# Patient Record
Sex: Female | Born: 1980 | Race: Black or African American | Hispanic: No | Marital: Single | Smoking: Former smoker
Health system: Southern US, Community
[De-identification: ages and names within clinical notes are randomized; demographics above are authoritative.]

## PROBLEM LIST (undated history)

## (undated) DIAGNOSIS — I671 Cerebral aneurysm, nonruptured: Secondary | ICD-10-CM

## (undated) DIAGNOSIS — D61818 Other pancytopenia: Secondary | ICD-10-CM

## (undated) DIAGNOSIS — I319 Disease of pericardium, unspecified: Secondary | ICD-10-CM

## (undated) DIAGNOSIS — N189 Chronic kidney disease, unspecified: Secondary | ICD-10-CM

## (undated) DIAGNOSIS — I10 Essential (primary) hypertension: Secondary | ICD-10-CM

## (undated) DIAGNOSIS — M329 Systemic lupus erythematosus, unspecified: Secondary | ICD-10-CM

## (undated) DIAGNOSIS — I609 Nontraumatic subarachnoid hemorrhage, unspecified: Secondary | ICD-10-CM

## (undated) DIAGNOSIS — N186 End stage renal disease: Secondary | ICD-10-CM

## (undated) HISTORY — PX: BACK SURGERY: SHX140

## (undated) HISTORY — PX: BRAIN SURGERY: SHX531

---

## 2014-12-29 ENCOUNTER — Emergency Department (HOSPITAL_COMMUNITY): Payer: BLUE CROSS/BLUE SHIELD

## 2014-12-29 ENCOUNTER — Encounter (HOSPITAL_COMMUNITY): Payer: Self-pay

## 2014-12-29 ENCOUNTER — Inpatient Hospital Stay (HOSPITAL_COMMUNITY)
Admission: EM | Admit: 2014-12-29 | Discharge: 2015-01-08 | DRG: 683 | Disposition: A | Discharge status: Home / Self Care | Payer: BLUE CROSS/BLUE SHIELD | Attending: Internal Medicine | Admitting: Internal Medicine

## 2014-12-29 DIAGNOSIS — I129 Hypertensive chronic kidney disease with stage 1 through stage 4 chronic kidney disease, or unspecified chronic kidney disease: Secondary | ICD-10-CM | POA: Diagnosis present

## 2014-12-29 DIAGNOSIS — G43001 Migraine without aura, not intractable, with status migrainosus: Secondary | ICD-10-CM | POA: Diagnosis present

## 2014-12-29 DIAGNOSIS — E785 Hyperlipidemia, unspecified: Secondary | ICD-10-CM | POA: Diagnosis present

## 2014-12-29 DIAGNOSIS — Z7952 Long term (current) use of systemic steroids: Secondary | ICD-10-CM | POA: Diagnosis not present

## 2014-12-29 DIAGNOSIS — Z9889 Other specified postprocedural states: Secondary | ICD-10-CM

## 2014-12-29 DIAGNOSIS — R519 Headache, unspecified: Secondary | ICD-10-CM | POA: Insufficient documentation

## 2014-12-29 DIAGNOSIS — N179 Acute kidney failure, unspecified: Secondary | ICD-10-CM

## 2014-12-29 DIAGNOSIS — R339 Retention of urine, unspecified: Secondary | ICD-10-CM | POA: Diagnosis present

## 2014-12-29 DIAGNOSIS — M3214 Glomerular disease in systemic lupus erythematosus: Secondary | ICD-10-CM | POA: Diagnosis present

## 2014-12-29 DIAGNOSIS — E119 Type 2 diabetes mellitus without complications: Secondary | ICD-10-CM | POA: Diagnosis present

## 2014-12-29 DIAGNOSIS — E669 Obesity, unspecified: Secondary | ICD-10-CM | POA: Diagnosis present

## 2014-12-29 DIAGNOSIS — G43009 Migraine without aura, not intractable, without status migrainosus: Secondary | ICD-10-CM | POA: Diagnosis present

## 2014-12-29 DIAGNOSIS — I69354 Hemiplegia and hemiparesis following cerebral infarction affecting left non-dominant side: Secondary | ICD-10-CM | POA: Diagnosis not present

## 2014-12-29 DIAGNOSIS — M329 Systemic lupus erythematosus, unspecified: Secondary | ICD-10-CM | POA: Diagnosis present

## 2014-12-29 DIAGNOSIS — Z87891 Personal history of nicotine dependence: Secondary | ICD-10-CM

## 2014-12-29 DIAGNOSIS — I160 Hypertensive urgency: Secondary | ICD-10-CM | POA: Diagnosis present

## 2014-12-29 DIAGNOSIS — R51 Headache: Secondary | ICD-10-CM | POA: Diagnosis present

## 2014-12-29 DIAGNOSIS — Z79899 Other long term (current) drug therapy: Secondary | ICD-10-CM | POA: Diagnosis not present

## 2014-12-29 DIAGNOSIS — IMO0002 Reserved for concepts with insufficient information to code with codable children: Secondary | ICD-10-CM

## 2014-12-29 DIAGNOSIS — N183 Chronic kidney disease, stage 3 (moderate): Secondary | ICD-10-CM | POA: Diagnosis not present

## 2014-12-29 DIAGNOSIS — I10 Essential (primary) hypertension: Secondary | ICD-10-CM | POA: Diagnosis not present

## 2014-12-29 DIAGNOSIS — Z79891 Long term (current) use of opiate analgesic: Secondary | ICD-10-CM | POA: Diagnosis not present

## 2014-12-29 DIAGNOSIS — I609 Nontraumatic subarachnoid hemorrhage, unspecified: Secondary | ICD-10-CM | POA: Diagnosis not present

## 2014-12-29 DIAGNOSIS — Z8679 Personal history of other diseases of the circulatory system: Secondary | ICD-10-CM

## 2014-12-29 DIAGNOSIS — E1165 Type 2 diabetes mellitus with hyperglycemia: Secondary | ICD-10-CM | POA: Diagnosis present

## 2014-12-29 DIAGNOSIS — N189 Chronic kidney disease, unspecified: Secondary | ICD-10-CM | POA: Diagnosis present

## 2014-12-29 DIAGNOSIS — G43011 Migraine without aura, intractable, with status migrainosus: Secondary | ICD-10-CM | POA: Diagnosis present

## 2014-12-29 DIAGNOSIS — I607 Nontraumatic subarachnoid hemorrhage from unspecified intracranial artery: Secondary | ICD-10-CM

## 2014-12-29 HISTORY — DX: Systemic lupus erythematosus, unspecified: M32.9

## 2014-12-29 HISTORY — DX: Cerebral aneurysm, nonruptured: I67.1

## 2014-12-29 HISTORY — DX: Essential (primary) hypertension: I10

## 2014-12-29 HISTORY — DX: Chronic kidney disease, unspecified: N18.9

## 2014-12-29 LAB — CBC
HCT: 30.6 % — ABNORMAL LOW (ref 36.0–46.0)
HEMOGLOBIN: 9.4 g/dL — AB (ref 12.0–15.0)
MCH: 25.7 pg — ABNORMAL LOW (ref 26.0–34.0)
MCHC: 30.7 g/dL (ref 30.0–36.0)
MCV: 83.6 fL (ref 78.0–100.0)
PLATELETS: 147 10*3/uL — AB (ref 150–400)
RBC: 3.66 MIL/uL — ABNORMAL LOW (ref 3.87–5.11)
RDW: 15 % (ref 11.5–15.5)
WBC: 3.3 10*3/uL — AB (ref 4.0–10.5)

## 2014-12-29 LAB — URINALYSIS, ROUTINE W REFLEX MICROSCOPIC
Bilirubin Urine: NEGATIVE
GLUCOSE, UA: NEGATIVE mg/dL
KETONES UR: NEGATIVE mg/dL
Nitrite: NEGATIVE
Specific Gravity, Urine: 1.016 (ref 1.005–1.030)
Urobilinogen, UA: 0.2 mg/dL (ref 0.0–1.0)
pH: 6.5 (ref 5.0–8.0)

## 2014-12-29 LAB — COMPREHENSIVE METABOLIC PANEL
ALBUMIN: 3.2 g/dL — AB (ref 3.5–5.2)
ALT: 21 U/L (ref 0–35)
AST: 26 U/L (ref 0–37)
Alkaline Phosphatase: 47 U/L (ref 39–117)
Anion gap: 11 (ref 5–15)
BUN: 45 mg/dL — ABNORMAL HIGH (ref 6–23)
CO2: 22 mmol/L (ref 19–32)
Calcium: 8.7 mg/dL (ref 8.4–10.5)
Chloride: 109 mmol/L (ref 96–112)
Creatinine, Ser: 2.5 mg/dL — ABNORMAL HIGH (ref 0.50–1.10)
GFR calc Af Amer: 28 mL/min — ABNORMAL LOW (ref >90)
GFR calc non Af Amer: 24 mL/min — ABNORMAL LOW (ref >90)
GLUCOSE: 134 mg/dL — AB (ref 70–99)
Potassium: 4.2 mmol/L (ref 3.5–5.1)
SODIUM: 142 mmol/L (ref 135–145)
TOTAL PROTEIN: 7.1 g/dL (ref 6.0–8.3)
Total Bilirubin: 0.8 mg/dL (ref 0.3–1.2)

## 2014-12-29 LAB — I-STAT CHEM 8, ED
BUN: 39 mg/dL — ABNORMAL HIGH (ref 6–23)
CREATININE: 2.6 mg/dL — AB (ref 0.50–1.10)
Calcium, Ion: 1.18 mmol/L (ref 1.12–1.23)
Chloride: 110 mmol/L (ref 96–112)
Glucose, Bld: 138 mg/dL — ABNORMAL HIGH (ref 70–99)
HCT: 31 % — ABNORMAL LOW (ref 36.0–46.0)
HEMOGLOBIN: 10.5 g/dL — AB (ref 12.0–15.0)
Potassium: 4.3 mmol/L (ref 3.5–5.1)
SODIUM: 142 mmol/L (ref 135–145)
TCO2: 19 mmol/L (ref 0–100)

## 2014-12-29 LAB — DIFFERENTIAL
Basophils Absolute: 0 10*3/uL (ref 0.0–0.1)
Basophils Relative: 0 % (ref 0–1)
EOS PCT: 0 % (ref 0–5)
Eosinophils Absolute: 0 10*3/uL (ref 0.0–0.7)
LYMPHS ABS: 0.8 10*3/uL (ref 0.7–4.0)
LYMPHS PCT: 25 % (ref 12–46)
MONOS PCT: 7 % (ref 3–12)
Monocytes Absolute: 0.2 10*3/uL (ref 0.1–1.0)
Neutro Abs: 2.3 10*3/uL (ref 1.7–7.7)
Neutrophils Relative %: 69 % (ref 43–77)

## 2014-12-29 LAB — I-STAT TROPONIN, ED: TROPONIN I, POC: 0.04 ng/mL (ref 0.00–0.08)

## 2014-12-29 LAB — RAPID URINE DRUG SCREEN, HOSP PERFORMED
Amphetamines: NOT DETECTED
BARBITURATES: NOT DETECTED
BENZODIAZEPINES: NOT DETECTED
Cocaine: NOT DETECTED
Opiates: POSITIVE — AB
Tetrahydrocannabinol: NOT DETECTED

## 2014-12-29 LAB — ETHANOL: Alcohol, Ethyl (B): <5 mg/dL (ref 0–9)

## 2014-12-29 LAB — APTT: aPTT: 27 seconds (ref 24–37)

## 2014-12-29 LAB — URINE MICROSCOPIC-ADD ON

## 2014-12-29 LAB — PROTIME-INR
INR: 0.91 (ref 0.00–1.49)
PROTHROMBIN TIME: 12.4 s (ref 11.6–15.2)

## 2014-12-29 LAB — CBG MONITORING, ED: Glucose-Capillary: 128 mg/dL — ABNORMAL HIGH (ref 70–99)

## 2014-12-29 MED ORDER — METOPROLOL TARTRATE 1 MG/ML IV SOLN
5.0000 mg | Freq: Once | INTRAVENOUS | Status: AC
  Administered 2014-12-29: 5 mg via INTRAVENOUS
  Filled 2014-12-29: qty 5

## 2014-12-29 MED ORDER — ENALAPRIL MALEATE 5 MG PO TABS
5.0000 mg | ORAL_TABLET | Freq: Every day | ORAL | Status: DC
  Administered 2014-12-30: 5 mg via ORAL
  Filled 2014-12-29: qty 1

## 2014-12-29 MED ORDER — BUPROPION HCL ER (XL) 300 MG PO TB24
300.0000 mg | ORAL_TABLET | Freq: Every day | ORAL | Status: DC
  Administered 2014-12-30 – 2015-01-08 (×10): 300 mg via ORAL
  Filled 2014-12-29 (×10): qty 1

## 2014-12-29 MED ORDER — OXYCODONE-ACETAMINOPHEN 5-325 MG PO TABS
1.0000 | ORAL_TABLET | Freq: Two times a day (BID) | ORAL | Status: DC | PRN
  Administered 2014-12-29 – 2015-01-03 (×10): 1 via ORAL
  Filled 2014-12-29 (×11): qty 1

## 2014-12-29 MED ORDER — GLIPIZIDE 5 MG PO TABS
5.0000 mg | ORAL_TABLET | Freq: Every day | ORAL | Status: DC
  Administered 2014-12-30: 5 mg via ORAL
  Filled 2014-12-29 (×3): qty 1

## 2014-12-29 MED ORDER — MORPHINE SULFATE 4 MG/ML IJ SOLN
4.0000 mg | Freq: Once | INTRAMUSCULAR | Status: AC
  Administered 2014-12-29: 4 mg via INTRAVENOUS
  Filled 2014-12-29: qty 1

## 2014-12-29 MED ORDER — GABAPENTIN 300 MG PO CAPS
600.0000 mg | ORAL_CAPSULE | Freq: Three times a day (TID) | ORAL | Status: DC
  Administered 2014-12-30 – 2014-12-31 (×5): 600 mg via ORAL
  Filled 2014-12-29 (×7): qty 2

## 2014-12-29 MED ORDER — ATORVASTATIN CALCIUM 10 MG PO TABS
10.0000 mg | ORAL_TABLET | Freq: Every evening | ORAL | Status: DC
  Administered 2014-12-30: 10 mg via ORAL
  Filled 2014-12-29 (×2): qty 1

## 2014-12-29 MED ORDER — PREDNISONE 10 MG PO TABS
10.0000 mg | ORAL_TABLET | Freq: Two times a day (BID) | ORAL | Status: DC
  Administered 2014-12-30 – 2015-01-03 (×9): 10 mg via ORAL
  Filled 2014-12-29 (×11): qty 1

## 2014-12-29 MED ORDER — METOPROLOL SUCCINATE ER 25 MG PO TB24
25.0000 mg | ORAL_TABLET | Freq: Two times a day (BID) | ORAL | Status: DC
  Administered 2014-12-30 – 2014-12-31 (×4): 25 mg via ORAL
  Filled 2014-12-29 (×6): qty 1

## 2014-12-29 MED ORDER — LORAZEPAM 2 MG/ML IJ SOLN
1.0000 mg | Freq: Once | INTRAMUSCULAR | Status: AC
  Administered 2014-12-29: 1 mg via INTRAVENOUS
  Filled 2014-12-29: qty 1

## 2014-12-29 MED ORDER — ONDANSETRON HCL 4 MG/2ML IJ SOLN
4.0000 mg | Freq: Once | INTRAMUSCULAR | Status: AC
  Administered 2014-12-29: 4 mg via INTRAVENOUS
  Filled 2014-12-29: qty 2

## 2014-12-29 MED ORDER — LABETALOL HCL 5 MG/ML IV SOLN
10.0000 mg | Freq: Once | INTRAVENOUS | Status: AC
  Administered 2014-12-29: 10 mg via INTRAVENOUS
  Filled 2014-12-29: qty 4

## 2014-12-29 MED ORDER — MYCOPHENOLATE MOFETIL 250 MG PO CAPS
1000.0000 mg | ORAL_CAPSULE | Freq: Two times a day (BID) | ORAL | Status: DC
  Administered 2014-12-30 – 2015-01-05 (×13): 1000 mg via ORAL
  Filled 2014-12-29 (×15): qty 4

## 2014-12-29 MED ORDER — GABAPENTIN 600 MG PO TABS
600.0000 mg | ORAL_TABLET | Freq: Three times a day (TID) | ORAL | Status: DC
  Filled 2014-12-29: qty 1

## 2014-12-29 MED ORDER — OXYBUTYNIN CHLORIDE ER 10 MG PO TB24
10.0000 mg | ORAL_TABLET | Freq: Every day | ORAL | Status: DC
  Administered 2014-12-30 (×2): 10 mg via ORAL
  Filled 2014-12-29 (×6): qty 1

## 2014-12-29 MED ORDER — ADULT MULTIVITAMIN W/MINERALS CH
1.0000 | ORAL_TABLET | Freq: Every day | ORAL | Status: DC
  Administered 2014-12-30 – 2015-01-07 (×8): 1 via ORAL
  Filled 2014-12-29 (×10): qty 1

## 2014-12-29 MED ORDER — HYDRALAZINE HCL 20 MG/ML IJ SOLN
5.0000 mg | Freq: Once | INTRAMUSCULAR | Status: AC
  Administered 2014-12-29: 5 mg via INTRAVENOUS
  Filled 2014-12-29: qty 1

## 2014-12-29 MED ORDER — LABETALOL HCL 5 MG/ML IV SOLN
10.0000 mg | INTRAVENOUS | Status: DC | PRN
  Administered 2014-12-31 – 2015-01-02 (×7): 10 mg via INTRAVENOUS
  Filled 2014-12-29 (×10): qty 4

## 2014-12-29 NOTE — ED Notes (Signed)
Pt is aware of the need for urine sample.  

## 2014-12-29 NOTE — ED Notes (Signed)
Unable to cut ear rings out of patients right ear. Admitting physician aware and hold on the MRI until further notice.

## 2014-12-29 NOTE — ED Notes (Signed)
Pt c/o headache starting this morning.  Pain score 10/10.  Pt reports taking ibuprofen w/o relief.  Sts blurred vision in R eye and bilateral light sensitivity.  Pt reports having brain aneurysms x 2 years ago.

## 2014-12-29 NOTE — H&P (Signed)
[facility] History and Physical  [name] [id number] DOB: 06-12-1981 DOA: 12/29/2014  Referring physician: EDP PCP: Pcp Not In System   Chief Complaint: Headache   HPI: Adriana Tucker is a 34 y.o. 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  Wound up with cerebellar strokes initially patient was locked-in; however, very luckily with a large amount of physical therapy has regained near total R sided strength, still has chronic left sided weakness at baseline now.  Patient presents to the ED with gradual onset, severe diffuse headache.  Symptoms onset at 9 AM this morning and progressively worsening.  She is visiting the area from [place].  Her L sided weakness from prior aneurysm related stroke is unchanged.  Her BP is elevated on arrival to ED despite compliance with medications.  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  Review of Systems: Systems reviewed.  As above, otherwise negative  Past Medical History  Diagnosis Date  . Hypertension   . Brain aneurysm   . Lupus    Past Surgical History  Procedure Laterality Date  . Brain surgery    . Back surgery     Social History:  reports that she has quit smoking. She does not have any smokeless tobacco history on file. She reports that she does not drink alcohol or use illicit drugs.  No Known Allergies  History reviewed. No pertinent family history.   Prior to Admission medications   Medication Sig Start Date End Date Taking? Authorizing Provider  atorvastatin (LIPITOR) 10 MG tablet Take 10 mg by mouth every evening.   Yes Historical Provider, MD  buPROPion (WELLBUTRIN XL) 300 MG 24 hr tablet Take 300 mg by mouth daily.   Yes Historical Provider, MD  enalapril (VASOTEC) 5 MG tablet Take 5 mg by mouth daily.   Yes Historical Provider, MD  furosemide (LASIX) 20 MG tablet Take 20 mg by mouth daily as needed  (swelling).   Yes Historical Provider, MD  gabapentin (NEURONTIN) 600 MG tablet Take 600 mg by mouth 3 (three) times daily.   Yes Historical Provider, MD  glipiZIDE (GLUCOTROL) 5 MG tablet Take 5 mg by mouth daily before breakfast.   Yes Historical Provider, MD  metoprolol succinate (TOPROL-XL) 25 MG 24 hr tablet Take 25 mg by mouth 2 (two) times daily.   Yes Historical Provider, MD  Multiple Vitamins-Minerals (MULTIVITAMIN WITH MINERALS) tablet Take 1 tablet by mouth daily.   Yes Historical Provider, MD  mycophenolate (CELLCEPT) 250 MG capsule Take 1,000 mg by mouth every 12 (twelve) hours.   Yes Historical Provider, MD  oxyCODONE-acetaminophen (PERCOCET/ROXICET) 5-325 MG per tablet Take 1 tablet by mouth 2 (two) times daily as needed for severe pain (Maximum dose is 2 tablets per day).   Yes Historical Provider, MD  predniSONE (DELTASONE) 10 MG tablet Take 10 mg by mouth 2 (two) times daily with a meal.   Yes Historical Provider, MD  tolterodine (DETROL) 2 MG tablet Take 4 mg by mouth 2 (two) times daily.   Yes Historical Provider, MD   Physical Exam: Filed Vitals:   12/29/14 2034  BP: 182/103  Pulse:   Temp:   Resp:     BP 182/103 mmHg  Pulse 76  Temp(Src) 98.2 F (36.8 C) (Oral)  Resp 14  SpO2 100%  LMP 10/30/2014  General Appearance:    Alert, oriented, no distress, appears stated age  Head:    Normocephalic, atraumatic  Eyes:    PERRL, EOMI, sclera non-icteric        Nose:   Nares without drainage or epistaxis. Mucosa, turbinates normal  Throat:   Moist mucous membranes. Oropharynx without erythema or exudate.  Neck:   Supple. No carotid bruits.  No thyromegaly.  No lymphadenopathy.   Back:     No CVA tenderness, no spinal tenderness  Lungs:     Clear to auscultation bilaterally, without wheezes, rhonchi or rales  Chest wall:    No tenderness to palpitation  Heart:    Regular rate and rhythm without murmurs, gallops, rubs  Abdomen:     Soft, non-tender, nondistended,  normal bowel sounds, no organomegaly  Genitalia:    deferred  Rectal:    deferred  Extremities:   No clubbing, cyanosis or edema.  Pulses:   2+ and symmetric all extremities  Skin:   Skin color, texture, turgor normal, no rashes or lesions  Lymph nodes:   Cervical, supraclavicular, and axillary nodes normal  Neurologic:   CNII-XII intact. Normal strength, sensation and reflexes      throughout    Labs on Admission:  Basic Metabolic Panel:  Recent Labs Lab 12/29/14 1549 12/29/14 1556  NA 142 142  K 4.2 4.3  CL 109 110  CO2 22  --   GLUCOSE 134* 138*  BUN 45* 39*  CREATININE 2.50* 2.60*  CALCIUM 8.7  --    Liver Function Tests:  Recent Labs Lab 12/29/14 1549  AST 26  ALT 21  ALKPHOS 47  BILITOT 0.8  PROT 7.1  ALBUMIN 3.2*   No results for input(s): LIPASE, AMYLASE in the last 168 hours. No results for input(s): AMMONIA in the last 168 hours. CBC:  Recent Labs Lab 12/29/14 1549 12/29/14 1556  WBC 3.3*  --   NEUTROABS 2.3  --   HGB 9.4* 10.5*  HCT 30.6* 31.0*  MCV 83.6  --   PLT 147*  --    Cardiac Enzymes: No results for input(s): CKTOTAL, CKMB, CKMBINDEX, TROPONINI in the last 168 hours.  BNP (last 3 results) No results for input(s): PROBNP in the last 8760 hours. CBG:  Recent Labs Lab 12/29/14 1540  GLUCAP 128*    Radiological Exams on Admission: Ct Head Wo Contrast  12/29/2014   CLINICAL DATA:  Severe headaches. Dizziness and hypertension. History of cerebral aneurysm.  EXAM: CT HEAD WITHOUT CONTRAST  TECHNIQUE: Contiguous axial images were obtained from the base of the skull through the vertex without intravenous contrast.  COMPARISON:  None.  FINDINGS: The Ing age indeterminate 7 mm a lacunar infarct is present in the right thalamus. More prominent cerebellar infarcts are noted bilaterally, somewhat more prominent on the left. These appear remote ladder also age indeterminate.  No acute hemorrhage or mass lesion is evident. The ventricles are of  normal size. No significant extraaxial fluid collection is present.  The paranasal sinuses and mastoid air cells are clear. The calvarium is intact.  IMPRESSION: 1. Bilateral cerebellar infarcts, left greater than right. Although several of these lesions appear remote, there are age indeterminate. 2. Age indeterminate 7 mm right splenic infarct. This is also within the posterior circulation territory. 3. No evidence for acute or remote ischemia within the anterior circulation.   Electronically Signed   By: San Morelle M.D.   On: 12/29/2014 16:13    EKG: Independently reviewed.  Assessment/Plan Principal Problem:   Hypertensive urgency Active Problems:   History of  cerebral aneurysm repair   1. Hypertensive urgency with h/o cerebral aneurysm repair - 1. Controlling BP with PRN meds in addition to home meds 2. We are trying to get an MRA to rule out recurrent aneurysm; while the patient does have "surgical steel" earing in place in her right ear, this has been in place since age 67 she and family report, and she does report at least 3 MRIs were performed without any ill effect to the patient during the course of her history with these piercings still in place.  The patient is able to accurately describe an MRI procedure and her most recent one was a mere 2 months ago (earings still in place at that time).  I dont really have a reason to doubt the accuracy of this report by the patient and 2 family members including patients mother via phone.  We are currently talking with our MRI tech to see if we can get the MRI done despite presence of earings since we are currently unable to cut them out, and her creatinine precludes CTA. 2. DM2 - continue home meds 3. CKD - unknown baseline, known to be secondary to SLE however for which we will continue her meds.  Code Status: Full Code  Family Communication: Family at bedside Disposition Plan: Admit to inpatient   Time spent: 70 min  GARDNER,  JARED M. [facility] Pager [phone]  If 7AM-7PM, please contact the day team taking care of the patient [email or web address] Password [email or web address] 12/29/2014, 9:07 PM

## 2014-12-29 NOTE — ED Notes (Signed)
Patient transported to MRI 

## 2014-12-29 NOTE — ED Provider Notes (Signed)
CSN: [id number]     Arrival date & time 12/29/14  1443 History   First MD Initiated Contact with Patient 12/29/14 1527     Chief Complaint  Patient presents with  . Headache  . Hypertension     (Consider location/radiation/quality/duration/timing/severity/associated sxs/prior Treatment) HPI Comments: Patient reports gradual onset of diffuse headache around 9 AM this progressively worsening. Patient with history of brain aneurysm and subarachnoid hemorrhage 2 with surgery performed in [place]. She is visiting. She denies thunderclap onset. She denies any fever. She endorses photophobia with nausea. She has persistent left-sided weakness from her previous stroke which is unchanged. Her blood pressure is elevated on arrival and she states compliance with her medications. She endorses some blurry vision in her right eye. Denies any Visual field cuts. Reports headache is similar to previous but normally improves with ibuprofen which did not happen today. Denies any chest pain or shortness of breath. She also has lupus and chronic steroids  Patient is a 34 y.o. female presenting with hypertension. The history is provided by the patient.  Hypertension Associated symptoms include headaches. Pertinent negatives include no chest pain, no abdominal pain and no shortness of breath.    Past Medical History  Diagnosis Date  . Hypertension   . Brain aneurysm   . Lupus   . CKD (chronic kidney disease)     due to lupus   Past Surgical History  Procedure Laterality Date  . Brain surgery    . Back surgery     History reviewed. No pertinent family history. History  Substance Use Topics  . Smoking status: Former Research scientist (life sciences)  . Smokeless tobacco: Not on file  . Alcohol Use: No   OB History    No data available     Review of Systems  Constitutional: Negative for fever, activity change and appetite change.  HENT: Negative for congestion and rhinorrhea.   Eyes: Positive for photophobia and visual  disturbance.  Respiratory: Negative for cough, chest tightness and shortness of breath.   Cardiovascular: Negative for chest pain.  Gastrointestinal: Negative for nausea, vomiting and abdominal pain.  Genitourinary: Negative for dysuria, hematuria, vaginal bleeding and vaginal discharge.  Musculoskeletal: Negative for myalgias and arthralgias.  Skin: Negative for rash.  Neurological: Positive for weakness and headaches. Negative for syncope and numbness.  A complete 10 system review of systems was obtained and all systems are negative except as noted in the HPI and PMH.      Allergies  Review of patient's allergies indicates no known allergies.  Home Medications   Prior to Admission medications   Medication Sig Start Date End Date Taking? Authorizing Provider  atorvastatin (LIPITOR) 10 MG tablet Take 10 mg by mouth every evening.   Yes Historical Provider, MD  buPROPion (WELLBUTRIN XL) 300 MG 24 hr tablet Take 300 mg by mouth daily.   Yes Historical Provider, MD  enalapril (VASOTEC) 5 MG tablet Take 5 mg by mouth daily.   Yes Historical Provider, MD  furosemide (LASIX) 20 MG tablet Take 20 mg by mouth daily as needed (swelling).   Yes Historical Provider, MD  gabapentin (NEURONTIN) 600 MG tablet Take 600 mg by mouth 3 (three) times daily.   Yes Historical Provider, MD  glipiZIDE (GLUCOTROL) 5 MG tablet Take 5 mg by mouth daily before breakfast.   Yes Historical Provider, MD  metoprolol succinate (TOPROL-XL) 25 MG 24 hr tablet Take 25 mg by mouth 2 (two) times daily.   Yes Historical Provider, MD  Multiple  Vitamins-Minerals (MULTIVITAMIN WITH MINERALS) tablet Take 1 tablet by mouth daily.   Yes Historical Provider, MD  mycophenolate (CELLCEPT) 250 MG capsule Take 1,000 mg by mouth every 12 (twelve) hours.   Yes Historical Provider, MD  oxyCODONE-acetaminophen (PERCOCET/ROXICET) 5-325 MG per tablet Take 1 tablet by mouth 2 (two) times daily as needed for severe pain (Maximum dose is 2  tablets per day).   Yes Historical Provider, MD  predniSONE (DELTASONE) 10 MG tablet Take 10 mg by mouth 2 (two) times daily with a meal.   Yes Historical Provider, MD  tolterodine (DETROL) 2 MG tablet Take 4 mg by mouth 2 (two) times daily.   Yes Historical Provider, MD   BP 148/95 mmHg  Pulse 85  Temp(Src) 97.8 F (36.6 C) (Oral)  Resp 18  Ht 5\' 2"  (1.575 m)  Wt 211 lb 13.8 oz (96.1 kg)  BMI 38.74 kg/m2  SpO2 97%  LMP 10/30/2014 Physical Exam  Constitutional: She is oriented to person, place, and time. She appears well-developed and well-nourished. No distress.  HENT:  Head: Normocephalic and atraumatic.  Mouth/Throat: Oropharynx is clear and moist. No oropharyngeal exudate.  Eyes: Conjunctivae and EOM are normal. Pupils are equal, round, and reactive to light.  Neck: Normal range of motion. Neck supple.  No meningismus  Cardiovascular: Normal rate, regular rhythm and normal heart sounds.   No murmur heard. Pulmonary/Chest: Effort normal and breath sounds normal. No respiratory distress.  Abdominal: Soft. There is no tenderness. There is no rebound and no guarding.  Musculoskeletal: Normal range of motion. She exhibits no edema or tenderness.  Neurological: She is alert and oriented to person, place, and time.  Patient with chronic contracture left hand and wrist. 2/5 strength of left upper extremity, 3/5 strength of left lower cavity. 5/5 strength in right upper and right lower extremity.  Skin: Skin is warm.    ED Course  Procedures (including critical care time) Labs Review Labs Reviewed  CBC - Abnormal; Notable for the following:    WBC 3.3 (*)    RBC 3.66 (*)    Hemoglobin 9.4 (*)    HCT 30.6 (*)    MCH 25.7 (*)    Platelets 147 (*)    All other components within normal limits  COMPREHENSIVE METABOLIC PANEL - Abnormal; Notable for the following:    Glucose, Bld 134 (*)    BUN 45 (*)    Creatinine, Ser 2.50 (*)    Albumin 3.2 (*)    GFR calc non Af Amer 24 (*)     GFR calc Af Amer 28 (*)    All other components within normal limits  URINE RAPID DRUG SCREEN (HOSP PERFORMED) - Abnormal; Notable for the following:    Opiates POSITIVE (*)    All other components within normal limits  URINALYSIS, ROUTINE W REFLEX MICROSCOPIC - Abnormal; Notable for the following:    APPearance CLOUDY (*)    Hgb urine dipstick SMALL (*)    Protein, ur >300 (*)    Leukocytes, UA SMALL (*)    All other components within normal limits  URINE MICROSCOPIC-ADD ON - Abnormal; Notable for the following:    Squamous Epithelial / LPF MANY (*)    Bacteria, UA FEW (*)    All other components within normal limits  I-STAT CHEM 8, ED - Abnormal; Notable for the following:    BUN 39 (*)    Creatinine, Ser 2.60 (*)    Glucose, Bld 138 (*)    Hemoglobin  10.5 (*)    HCT 31.0 (*)    All other components within normal limits  CBG MONITORING, ED - Abnormal; Notable for the following:    Glucose-Capillary 128 (*)    All other components within normal limits  MRSA PCR SCREENING  ETHANOL  PROTIME-INR  APTT  DIFFERENTIAL  CBC  BASIC METABOLIC PANEL  I-STAT TROPOININ, ED  I-STAT TROPOININ, ED    Imaging Review Ct Head Wo Contrast  12/29/2014   CLINICAL DATA:  Severe headaches. Dizziness and hypertension. History of cerebral aneurysm.  EXAM: CT HEAD WITHOUT CONTRAST  TECHNIQUE: Contiguous axial images were obtained from the base of the skull through the vertex without intravenous contrast.  COMPARISON:  None.  FINDINGS: The Ing age indeterminate 7 mm a lacunar infarct is present in the right thalamus. More prominent cerebellar infarcts are noted bilaterally, somewhat more prominent on the left. These appear remote ladder also age indeterminate.  No acute hemorrhage or mass lesion is evident. The ventricles are of normal size. No significant extraaxial fluid collection is present.  The paranasal sinuses and mastoid air cells are clear. The calvarium is intact.  IMPRESSION: 1. Bilateral  cerebellar infarcts, left greater than right. Although several of these lesions appear remote, there are age indeterminate. 2. Age indeterminate 7 mm right splenic infarct. This is also within the posterior circulation territory. 3. No evidence for acute or remote ischemia within the anterior circulation.   Electronically Signed   By: San Morelle M.D.   On: 12/29/2014 16:13     EKG Interpretation None      MDM   Final diagnoses:  Hypertensive urgency  Headache, unspecified headache type   Gradual onset headache since this morning. Patient with history of subarachnoid hemorrhage status post repair. Endorses blurred vision in the right eye and light sensitivity. Her left-sided weakness is unchanged.  CT head shows bilateral cerebellar infarcts as well as splenic infarct. These appear to be old.  Discussed with Dr. Nicole Kindred. He agrees that the MRI findings appear to be old infarcts but does recommend MRI and MRA. Elevated creatinine precludes use of IV contrast for CTA. No indication for LP as symptoms were gradual in onset.   Blood pressure is uncontrolled.  Patient refuses to take out earrings for MRI. Patient states she will not take out her pharynx or MRI. She understands MRI cannot be done with these in place. She understands that we cannot rule out new stroke or new aneurysm.  Patient remains hypertensive despite IV hydralazine and metoprolol. She is in the process of trying to remove her earrings for MRI. She reportedly has had MRI in the past with earrings place but tech unwilling to perform MRI with earring in place. Given her uncontrolled blood pressure and persistent headache, will admit for further treatment. MRI pending at time of admission. CTA unable to be obtained given her elevated creatinine which by her report is at baseline.  D/w Dr. Alcario Drought.  CRITICAL CARE Performed by: Ezequiel Essex Total critical care time: 30 Critical care time was exclusive of  separately billable procedures and treating other patients. Critical care was necessary to treat or prevent imminent or life-threatening deterioration. Critical care was time spent personally by me on the following activities: development of treatment plan with patient and/or surrogate as well as nursing, discussions with consultants, evaluation of patient's response to treatment, examination of patient, obtaining history from patient or surrogate, ordering and performing treatments and interventions, ordering and review of laboratory studies, ordering and  review of radiographic studies, pulse oximetry and re-evaluation of patient's condition.   Ezequiel Essex, MD 12/30/14 [phone]

## 2014-12-29 NOTE — Progress Notes (Signed)
Pt not from [facility] pcp is in [place].  Adriana Tucker

## 2014-12-29 NOTE — ED Notes (Signed)
Patient has agreed to take ear rings out to have the MRI. Informed provider. Informed ortho tech to assist myself with taking them out. If ear rings are removed, the plan is to notify MRI.

## 2014-12-29 NOTE — ED Notes (Signed)
Pt reminded of the need for urine.  

## 2014-12-29 NOTE — ED Notes (Signed)
Patient transported to CT 

## 2014-12-30 ENCOUNTER — Inpatient Hospital Stay (HOSPITAL_COMMUNITY): Payer: BLUE CROSS/BLUE SHIELD

## 2014-12-30 DIAGNOSIS — N189 Chronic kidney disease, unspecified: Secondary | ICD-10-CM | POA: Diagnosis present

## 2014-12-30 DIAGNOSIS — G43001 Migraine without aura, not intractable, with status migrainosus: Secondary | ICD-10-CM | POA: Diagnosis present

## 2014-12-30 DIAGNOSIS — E785 Hyperlipidemia, unspecified: Secondary | ICD-10-CM | POA: Diagnosis present

## 2014-12-30 DIAGNOSIS — N183 Chronic kidney disease, stage 3 (moderate): Secondary | ICD-10-CM

## 2014-12-30 DIAGNOSIS — IMO0002 Reserved for concepts with insufficient information to code with codable children: Secondary | ICD-10-CM | POA: Diagnosis present

## 2014-12-30 DIAGNOSIS — E1165 Type 2 diabetes mellitus with hyperglycemia: Secondary | ICD-10-CM

## 2014-12-30 LAB — CBC WITH DIFFERENTIAL/PLATELET
BASOS ABS: 0 10*3/uL (ref 0.0–0.1)
BASOS PCT: 0 % (ref 0–1)
EOS PCT: 0 % (ref 0–5)
Eosinophils Absolute: 0 10*3/uL (ref 0.0–0.7)
HEMATOCRIT: 27.7 % — AB (ref 36.0–46.0)
HEMOGLOBIN: 8.6 g/dL — AB (ref 12.0–15.0)
Lymphocytes Relative: 26 % (ref 12–46)
Lymphs Abs: 0.9 10*3/uL (ref 0.7–4.0)
MCH: 25.9 pg — ABNORMAL LOW (ref 26.0–34.0)
MCHC: 31 g/dL (ref 30.0–36.0)
MCV: 83.4 fL (ref 78.0–100.0)
MONO ABS: 0.2 10*3/uL (ref 0.1–1.0)
MONOS PCT: 5 % (ref 3–12)
NEUTROS ABS: 2.3 10*3/uL (ref 1.7–7.7)
Neutrophils Relative %: 69 % (ref 43–77)
Platelets: 139 10*3/uL — ABNORMAL LOW (ref 150–400)
RBC: 3.32 MIL/uL — ABNORMAL LOW (ref 3.87–5.11)
RDW: 15.4 % (ref 11.5–15.5)
WBC: 3.3 10*3/uL — ABNORMAL LOW (ref 4.0–10.5)

## 2014-12-30 LAB — LIPID PANEL
CHOLESTEROL: 297 mg/dL — AB (ref 0–200)
HDL: 45 mg/dL (ref >39)
LDL Cholesterol: 207 mg/dL — ABNORMAL HIGH (ref 0–99)
Total CHOL/HDL Ratio: 6.6 RATIO
Triglycerides: 225 mg/dL — ABNORMAL HIGH (ref <150)
VLDL: 45 mg/dL — ABNORMAL HIGH (ref 0–40)

## 2014-12-30 LAB — GLUCOSE, CAPILLARY: GLUCOSE-CAPILLARY: 137 mg/dL — AB (ref 70–99)

## 2014-12-30 LAB — COMPREHENSIVE METABOLIC PANEL
ALT: 17 U/L (ref 0–35)
AST: 19 U/L (ref 0–37)
Albumin: 2.6 g/dL — ABNORMAL LOW (ref 3.5–5.2)
Alkaline Phosphatase: 42 U/L (ref 39–117)
Anion gap: 7 (ref 5–15)
BUN: 40 mg/dL — ABNORMAL HIGH (ref 6–23)
CALCIUM: 8.1 mg/dL — AB (ref 8.4–10.5)
CO2: 24 mmol/L (ref 19–32)
Chloride: 109 mmol/L (ref 96–112)
Creatinine, Ser: 2.69 mg/dL — ABNORMAL HIGH (ref 0.50–1.10)
GFR calc Af Amer: 25 mL/min — ABNORMAL LOW (ref >90)
GFR, EST NON AFRICAN AMERICAN: 22 mL/min — AB (ref >90)
GLUCOSE: 125 mg/dL — AB (ref 70–99)
POTASSIUM: 3.9 mmol/L (ref 3.5–5.1)
SODIUM: 140 mmol/L (ref 135–145)
Total Bilirubin: 0.5 mg/dL (ref 0.3–1.2)
Total Protein: 5.8 g/dL — ABNORMAL LOW (ref 6.0–8.3)

## 2014-12-30 LAB — MRSA PCR SCREENING: MRSA by PCR: NEGATIVE

## 2014-12-30 MED ORDER — HYDRALAZINE HCL 10 MG PO TABS
10.0000 mg | ORAL_TABLET | Freq: Three times a day (TID) | ORAL | Status: DC
  Administered 2014-12-30: 10 mg via ORAL
  Filled 2014-12-30 (×3): qty 1

## 2014-12-30 MED ORDER — SUMATRIPTAN SUCCINATE 6 MG/0.5ML ~~LOC~~ SOLN
6.0000 mg | Freq: Once | SUBCUTANEOUS | Status: AC
  Administered 2014-12-30: 6 mg via SUBCUTANEOUS
  Filled 2014-12-30: qty 0.5

## 2014-12-30 MED ORDER — LORAZEPAM 2 MG/ML IJ SOLN
0.5000 mg | Freq: Once | INTRAMUSCULAR | Status: AC
  Administered 2014-12-30: 0.5 mg via INTRAVENOUS

## 2014-12-30 MED ORDER — ATORVASTATIN CALCIUM 20 MG PO TABS
20.0000 mg | ORAL_TABLET | Freq: Every evening | ORAL | Status: DC
  Administered 2014-12-31: 20 mg via ORAL
  Filled 2014-12-30 (×2): qty 1

## 2014-12-30 MED ORDER — HYDRALAZINE HCL 10 MG PO TABS
15.0000 mg | ORAL_TABLET | Freq: Three times a day (TID) | ORAL | Status: DC
  Filled 2014-12-30 (×2): qty 2

## 2014-12-30 MED ORDER — HYDRALAZINE HCL 10 MG PO TABS
20.0000 mg | ORAL_TABLET | Freq: Three times a day (TID) | ORAL | Status: DC
  Administered 2014-12-30 – 2014-12-31 (×3): 20 mg via ORAL
  Filled 2014-12-30 (×5): qty 2

## 2014-12-30 MED ORDER — INSULIN GLARGINE 100 UNIT/ML ~~LOC~~ SOLN
5.0000 [IU] | Freq: Every day | SUBCUTANEOUS | Status: DC
  Administered 2014-12-30 – 2015-01-06 (×8): 5 [IU] via SUBCUTANEOUS
  Filled 2014-12-30 (×10): qty 0.05

## 2014-12-30 MED ORDER — ENALAPRIL MALEATE 10 MG PO TABS: 10.0000 mg | ORAL_TABLET | Freq: Every day | ORAL | Status: DC

## 2014-12-30 MED ORDER — LORAZEPAM 2 MG/ML IJ SOLN
INTRAMUSCULAR | Status: AC
Start: 2014-12-30 — End: 2014-12-30
  Administered 2014-12-30: 0.5 mg via INTRAVENOUS
  Filled 2014-12-30: qty 1

## 2014-12-30 NOTE — Progress Notes (Signed)
[facility] [place] TEAM 1 - Stepdown/ICU TEAM Progress Note  Adriana Tucker [id number] DOB: [birth year] DOA: 12/29/2014 PCP: Pcp Not In System  Admit HPI / Brief Narrative: Adriana Tucker is a 34 y.o. 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 Wound up with cerebellar strokes initially patient was locked-in; however, very luckily with a large amount of physical therapy has regained near total R sided strength, still has chronic left sided weakness at baseline now.  Patient presents to the ED with gradual onset, severe diffuse headache. Symptoms onset at 9 AM this morning and progressively worsening. She is visiting the area from [place]. Her L sided weakness from prior aneurysm related stroke is unchanged. Her BP is elevated on arrival to ED despite compliance with medications. 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   HPI/Subjective: 4/5 A/O 4, complains of continued headache which has improved with better BP control. States has migraine headaches whenever her BP uncontrolled, which is how she can determine her BP is elevated. States visiting from [place] has been taking her normal BP medication.  Assessment/Plan: Hypertensive urgency with h/o cerebral aneurysm repair  -CT/MRI negative for acute CVA -Continue metoprolol XL 25 mg BID -Hold enalapril secondary to chronic kidney disease  -Start hydralazine 20 mg TID  Chronic kidney disease (baseline unknown) -Secondary to lupus -See hypertensive urgency -Hold all nephrotoxic medication  DM type 2 uncontrolled - Hemoglobin A1c pending  -Lipid panel pending -Secondary to patient's poor renal function a better choice would be insulin; no need to adjust for renal function. - Start Lantus 5 units daily  HLD -Start Lipitor 20 mg daily. PCP to titrate up for effect  Migraine headache -Trigger is elevated BP -Imitrex  6 mg subcutaneous 1     Code Status: FULL Family Communication: no family present at time of exam Disposition Plan: Resolution hypertensive urgency    Consultants: NA  Procedure/Significant Events: 4/4 CT head without contrast;. Bilateral cerebellar infarcts, Lt> Rt. Several of these lesions appear remote. -Age indeterminate 7 mm right splenic infarct. Also within the posterior circulation territory. - No evidence for acute/ remote ischemia within the anterior circulation.  4/5 MRI/MRI head/brain:-No acute intracranial infarct or other abnormality identified. -Multiple remote bilateral cerebellar infarcts, Lt> Rt  with additional small remote lacunar infarct within the right thalamus. -Empty sella. MRA HEAD; Unremarkable    Culture NA  Antibiotics: NA  DVT prophylaxis: SCD   Devices NA   LINES / TUBES:  NA    Continuous Infusions:   Objective: VITAL SIGNS: Temp: 97.8 F (36.6 C) (04/05 1702) Temp Source: Oral (04/05 1702) BP: 153/86 mmHg (04/05 1702) Pulse Rate: 42 (04/05 1200) SPO2; FIO2:   Intake/Output Summary (Last 24 hours) at 12/30/14 1728 Last data filed at 12/30/14 1521  Gross per 24 hour  Intake     50 ml  Output    350 ml  Net   -300 ml     Exam: General: A/O 4, moderate distress secondary to migraine headache (photosensitive), No acute respiratory distress Lungs: Clear to auscultation bilaterally without wheezes or crackles Cardiovascular: Regular rate and rhythm without murmur gallop or rub normal S1 and S2 Abdomen: Nontender, nondistended, soft, bowel sounds positive, no rebound, no ascites, no appreciable mass Extremities: No significant cyanosis, clubbing, or edema bilateral lower extremities  Data Reviewed: Basic Metabolic Panel:  Recent Labs Lab 12/29/14 1549  12/29/14 1556 12/30/14 0958  NA 142 142 140  K 4.2 4.3 3.9  CL 109 110 109  CO2 22  --  24  GLUCOSE 134* 138* 125*  BUN 45* 39* 40*  CREATININE 2.50* 2.60*  2.69*  CALCIUM 8.7  --  8.1*   Liver Function Tests:  Recent Labs Lab 12/29/14 1549 12/30/14 0958  AST 26 19  ALT 21 17  ALKPHOS 47 42  BILITOT 0.8 0.5  PROT 7.1 5.8*  ALBUMIN 3.2* 2.6*   No results for input(s): LIPASE, AMYLASE in the last 168 hours. No results for input(s): AMMONIA in the last 168 hours. CBC:  Recent Labs Lab 12/29/14 1549 12/29/14 1556 12/30/14 0958  WBC 3.3*  --  3.3*  NEUTROABS 2.3  --  2.3  HGB 9.4* 10.5* 8.6*  HCT 30.6* 31.0* 27.7*  MCV 83.6  --  83.4  PLT 147*  --  139*   Cardiac Enzymes: No results for input(s): CKTOTAL, CKMB, CKMBINDEX, TROPONINI in the last 168 hours. BNP (last 3 results) No results for input(s): BNP in the last 8760 hours.  ProBNP (last 3 results) No results for input(s): PROBNP in the last 8760 hours.  CBG:  Recent Labs Lab 12/29/14 1540  GLUCAP 128*    Recent Results (from the past 240 hour(s))  MRSA PCR Screening     Status: None   Collection Time: 12/29/14 11:36 PM  Result Value Ref Range Status   MRSA by PCR NEGATIVE NEGATIVE Final    Comment:        The GeneXpert MRSA Assay (FDA approved for NASAL specimens only), is one component of a comprehensive MRSA colonization surveillance program. It is not intended to diagnose MRSA infection nor to guide or monitor treatment for MRSA infections.      Studies:  Recent x-ray studies have been reviewed in detail by the Attending Physician  Scheduled Meds:  Scheduled Meds: . [START ON 12/31/2014] atorvastatin  20 mg Oral QPM  . buPROPion  300 mg Oral Daily  . gabapentin  600 mg Oral TID  . hydrALAZINE  20 mg Oral 3 times per day  . insulin glargine  5 Units Subcutaneous QHS  . metoprolol succinate  25 mg Oral BID  . multivitamin with minerals  1 tablet Oral Daily  . mycophenolate  1,000 mg Oral Q12H  . oxybutynin  10 mg Oral QHS  . predniSONE  10 mg Oral BID WC    Time spent on care of this patient: 40 mins   WOODS, Geraldo Docker , MD  [facility] Office  [phone] Pager [phone]  On-Call/Text Page:      [email or web address]      password [email or web address]  If 7PM-7AM, please contact night-coverage [email or web address] Password [email or web address] 12/30/2014, 5:28 PM   LOS: 1 day   Care during the described time interval was provided by me .  I have reviewed this patient's available data, including medical history, events of note, physical examination, radiology studies and test results as part of my evaluation  Dia Crawford, MD [phone] Pager

## 2014-12-30 NOTE — Progress Notes (Signed)
Pt arrived with [facility] around 2315.  Pt stable, BP 138/90.  Headache 8/10; throbbing, R. Side dominant, light sensitive.  Pt received oxycodone-acetaminophen 5-325.  Will continue to monitor.

## 2014-12-30 NOTE — Progress Notes (Signed)
Nutrition Brief Note  Patient identified on the Malnutrition Screening Tool (MST) Report  Wt Readings from Last 15 Encounters:  12/29/14 211 lb 13.8 oz (96.1 kg)   Adriana Tucker is a 34 y.o. 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 Wound up with cerebellar strokes initially patient was locked-in; however, very luckily with a large amount of physical therapy has regained near total R sided strength, still has chronic left sided weakness at baseline now.  Body mass index is 38.74 kg/(m^2). Patient meets criteria for obesity, class II based on current BMI.   Current diet order is carb modifed, patient is consuming approximately 75% of meals at this time. Labs and medications reviewed.   No nutrition interventions warranted at this time. If nutrition issues arise, please consult RD.   Tiffane Sheldon A. Jimmye Norman, RD, LDN, CDE Pager: [phone] After hours Pager: [phone]

## 2014-12-30 NOTE — Progress Notes (Signed)
Utilization Review Completed.  

## 2014-12-31 LAB — GLUCOSE, CAPILLARY: Glucose-Capillary: 169 mg/dL — ABNORMAL HIGH (ref 70–99)

## 2014-12-31 LAB — HEMOGLOBIN A1C
Hgb A1c MFr Bld: 6.2 % — ABNORMAL HIGH (ref 4.8–5.6)
Mean Plasma Glucose: 131 mg/dL

## 2014-12-31 MED ORDER — FUROSEMIDE 10 MG/ML IJ SOLN
20.0000 mg | Freq: Every day | INTRAMUSCULAR | Status: DC
  Administered 2014-12-31 – 2015-01-05 (×5): 20 mg via INTRAVENOUS
  Filled 2014-12-31 (×6): qty 2

## 2014-12-31 MED ORDER — METOPROLOL SUCCINATE ER 50 MG PO TB24
50.0000 mg | ORAL_TABLET | Freq: Two times a day (BID) | ORAL | Status: DC
  Administered 2014-12-31: 50 mg via ORAL
  Filled 2014-12-31 (×3): qty 1

## 2014-12-31 MED ORDER — HYDRALAZINE HCL 50 MG PO TABS
50.0000 mg | ORAL_TABLET | Freq: Three times a day (TID) | ORAL | Status: DC
Start: 2014-12-31 — End: 2015-01-01
  Administered 2014-12-31 – 2015-01-01 (×2): 50 mg via ORAL
  Filled 2014-12-31 (×5): qty 1

## 2014-12-31 MED ORDER — ISOSORBIDE DINITRATE 5 MG PO TABS
5.0000 mg | ORAL_TABLET | Freq: Three times a day (TID) | ORAL | Status: DC
  Administered 2014-12-31 – 2015-01-03 (×8): 5 mg via ORAL
  Filled 2014-12-31 (×13): qty 1

## 2014-12-31 MED ORDER — HYDROMORPHONE HCL 1 MG/ML IJ SOLN
0.5000 mg | Freq: Once | INTRAMUSCULAR | Status: AC
  Administered 2015-01-01: 0.5 mg via INTRAVENOUS
  Filled 2014-12-31: qty 1

## 2014-12-31 MED ORDER — GABAPENTIN 400 MG PO CAPS
400.0000 mg | ORAL_CAPSULE | Freq: Three times a day (TID) | ORAL | Status: DC
  Administered 2014-12-31 – 2015-01-05 (×15): 400 mg via ORAL
  Filled 2014-12-31 (×16): qty 1

## 2014-12-31 NOTE — Progress Notes (Signed)
TEAM 1 - Stepdown/ICU TEAM Progress Note  Adriana Tucker [id number] DOB: 1981/07/07 DOA: 12/29/2014 PCP: Pcp Not In System  Admit HPI / Brief Narrative: 34 y.o. F Hx HTN, SLE on cellcpet and pred, CKD, and cerebellar aneurysm x2 s/p clipping after rupture > cerebellar strokes.  Initially patient was locked-in, however with a large amount of physical therapy has regained near total R sided strength w/ chronic left sided weakness at baseline.  She presented to the ED with gradual onset severe diffuse headache. Symptoms onset at 9 AM 12/29/14 w/ progressive worsening. She is visiting the area from [place]. Her L sided weakness from prior aneurysm related stroke was unchanged. Her BP was elevated on arrival to the ED despite reported compliance with her medications.  HPI/Subjective: Pt states her HA is much improved.  She denies cp, n/v, abdom pain, or sob.  She is anxious to be d/c home as she is due to return to [place] on Friday.    Assessment/Plan:  Hypertensive urgency with h/o cerebral aneurysm repair -CT/MRI negative for acute CVA -BP fluctuating but improved somewhat - pt denies noncompliance, and states her BP is usually [id number] systolic at the highest  -check TSH   -adjust medical tx and follow - avoid ACE/ARB due to kidney disease w/ unknown baseline   Chronic kidney disease (baseline unknown) - Lupus nephritis  -Secondary to lupus -Hold all nephrotoxic medications - cont home med regimen of cellcept and prednisone   SLE -Continue usual medical tx   Abnormal UA -check urine culture - pt does not have sx suggestive of UTI   DM type 2 uncontrolled -A1c 6.2  -Secondary to patient's poor renal function a better choice would be insulin; no need to adjust for renal function. -Start Lantus 5 units daily  HLD -LDL 207 - increased Lipitor 20 mg daily - PCP to titrate up for effect  Migraine headache -HA much improved today per pt   Obesity - Body mass index is 38.74  kg/(m^2).   Code Status: FULL Family Communication: no family present at time of exam - I placed call at pt's request to Dr Oleta Mouse at [facility] at [phone] but call was forwarded to a receptionist after hours Disposition Plan: SDU  Consultants: NA  Procedure/Significant Events: 4/4 CT head without contrast Bilateral cerebellar infarcts, Lt> Rt. Several of these lesions appear remote. -Age indeterminate 7 mm right splenic infarct. Also within the posterior circulation territory. - No evidence for acute/ remote ischemia within the anterior circulation. 4/5 MRI/MRI head/brain No acute intracranial infarct or other abnormality identified. -Multiple remote bilateral cerebellar infarcts, Lt> Rt  with additional small remote lacunar infarct within the right thalamus. -Empty sella. MRA HEAD; Unremarkable   Antibiotics: NA  DVT prophylaxis: SCD  Objective: Blood pressure 145/79, pulse 76, temperature 98.1 F (36.7 C), temperature source Oral, resp. rate 15, height 5\' 2"  (1.575 m), weight 96.1 kg (211 lb 13.8 oz), last menstrual period 10/30/2014, SpO2 100 %.  Intake/Output Summary (Last 24 hours) at 12/31/14 1644 Last data filed at 12/31/14 1211  Gross per 24 hour  Intake    562 ml  Output    200 ml  Net    362 ml   Exam: General: No acute respiratory distress Lungs: Clear to auscultation bilaterally without wheezes or crackles Cardiovascular: Regular rate and rhythm without murmur gallop or rub normal S1 and S2 Abdomen: Nontender, nondistended, soft, bowel sounds positive, no rebound, no ascites, no appreciable mass Extremities:  No significant cyanosis, clubbing, trace edema bilateral lower extremities  Data Reviewed: Basic Metabolic Panel:  Recent Labs Lab 12/29/14 1549 12/29/14 1556 12/30/14 0958  NA 142 142 140  K 4.2 4.3 3.9  CL 109 110 109  CO2 22  --  24  GLUCOSE 134* 138* 125*  BUN 45* 39* 40*  CREATININE 2.50* 2.60* 2.69*  CALCIUM 8.7  --  8.1*    Liver Function Tests:  Recent Labs Lab 12/29/14 1549 12/30/14 0958  AST 26 19  ALT 21 17  ALKPHOS 47 42  BILITOT 0.8 0.5  PROT 7.1 5.8*  ALBUMIN 3.2* 2.6*   CBC:  Recent Labs Lab 12/29/14 1549 12/29/14 1556 12/30/14 0958  WBC 3.3*  --  3.3*  NEUTROABS 2.3  --  2.3  HGB 9.4* 10.5* 8.6*  HCT 30.6* 31.0* 27.7*  MCV 83.6  --  83.4  PLT 147*  --  139*   CBG:  Recent Labs Lab 12/29/14 1540 12/30/14 2104  GLUCAP 128* 137*    Recent Results (from the past 240 hour(s))  MRSA PCR Screening     Status: None   Collection Time: 12/29/14 11:36 PM  Result Value Ref Range Status   MRSA by PCR NEGATIVE NEGATIVE Final    Comment:        The GeneXpert MRSA Assay (FDA approved for NASAL specimens only), is one component of a comprehensive MRSA colonization surveillance program. It is not intended to diagnose MRSA infection nor to guide or monitor treatment for MRSA infections.      Studies:  Recent x-ray studies have been reviewed in detail by the Attending Physician  Scheduled Meds:  Scheduled Meds: . atorvastatin  20 mg Oral QPM  . buPROPion  300 mg Oral Daily  . gabapentin  600 mg Oral TID  . hydrALAZINE  20 mg Oral 3 times per day  . insulin glargine  5 Units Subcutaneous QHS  . metoprolol succinate  25 mg Oral BID  . multivitamin with minerals  1 tablet Oral Daily  . mycophenolate  1,000 mg Oral Q12H  . oxybutynin  10 mg Oral QHS  . predniSONE  10 mg Oral BID WC    Time spent on care of this patient: 35 mins  Cherene Altes, MD [facility] For Consults/Admissions - Flow Manager - [phone] Office  [phone]  Contact MD directly via text page:      [email or web address]      password [email or web address]  12/31/2014, 4:44 PM   LOS: 2 days

## 2015-01-01 DIAGNOSIS — N189 Chronic kidney disease, unspecified: Secondary | ICD-10-CM

## 2015-01-01 DIAGNOSIS — E119 Type 2 diabetes mellitus without complications: Secondary | ICD-10-CM | POA: Diagnosis present

## 2015-01-01 DIAGNOSIS — N179 Acute kidney failure, unspecified: Secondary | ICD-10-CM | POA: Diagnosis present

## 2015-01-01 DIAGNOSIS — G43009 Migraine without aura, not intractable, without status migrainosus: Secondary | ICD-10-CM | POA: Diagnosis present

## 2015-01-01 LAB — RENAL FUNCTION PANEL
Albumin: 2.7 g/dL — ABNORMAL LOW (ref 3.5–5.2)
Anion gap: 8 (ref 5–15)
BUN: 48 mg/dL — AB (ref 6–23)
CO2: 20 mmol/L (ref 19–32)
CREATININE: 3.05 mg/dL — AB (ref 0.50–1.10)
Calcium: 8.5 mg/dL (ref 8.4–10.5)
Chloride: 112 mmol/L (ref 96–112)
GFR calc Af Amer: 22 mL/min — ABNORMAL LOW (ref >90)
GFR calc non Af Amer: 19 mL/min — ABNORMAL LOW (ref >90)
GLUCOSE: 214 mg/dL — AB (ref 70–99)
Phosphorus: 3.8 mg/dL (ref 2.3–4.6)
Potassium: 4.6 mmol/L (ref 3.5–5.1)
Sodium: 140 mmol/L (ref 135–145)

## 2015-01-01 LAB — URINE CULTURE
COLONY COUNT: NO GROWTH
CULTURE: NO GROWTH

## 2015-01-01 LAB — TSH: TSH: 0.498 u[IU]/mL (ref 0.350–4.500)

## 2015-01-01 LAB — CORTISOL: Cortisol, Plasma: 5.6 ug/dL

## 2015-01-01 MED ORDER — HYDROMORPHONE HCL 1 MG/ML IJ SOLN
1.0000 mg | Freq: Once | INTRAMUSCULAR | Status: AC
  Administered 2015-01-01: 1 mg via INTRAVENOUS
  Filled 2015-01-01: qty 1

## 2015-01-01 MED ORDER — METOPROLOL SUCCINATE ER 50 MG PO TB24
75.0000 mg | ORAL_TABLET | Freq: Two times a day (BID) | ORAL | Status: DC
  Administered 2015-01-01 (×2): 75 mg via ORAL
  Filled 2015-01-01 (×2): qty 1

## 2015-01-01 MED ORDER — HYDRALAZINE HCL 50 MG PO TABS
75.0000 mg | ORAL_TABLET | Freq: Three times a day (TID) | ORAL | Status: DC
  Filled 2015-01-01 (×3): qty 1

## 2015-01-01 MED ORDER — HYDRALAZINE HCL 50 MG PO TABS
75.0000 mg | ORAL_TABLET | Freq: Three times a day (TID) | ORAL | Status: DC
  Administered 2015-01-01 – 2015-01-02 (×3): 75 mg via ORAL
  Filled 2015-01-01 (×6): qty 1

## 2015-01-01 MED ORDER — TOLTERODINE TARTRATE 2 MG PO TABS
4.0000 mg | ORAL_TABLET | Freq: Two times a day (BID) | ORAL | Status: DC
  Administered 2015-01-01 (×2): 4 mg via ORAL
  Filled 2015-01-01 (×3): qty 2
  Filled 2015-01-01: qty 1
  Filled 2015-01-01 (×5): qty 2

## 2015-01-01 MED ORDER — METOPROLOL SUCCINATE ER 100 MG PO TB24
100.0000 mg | ORAL_TABLET | Freq: Two times a day (BID) | ORAL | Status: DC
  Administered 2015-01-02 – 2015-01-08 (×13): 100 mg via ORAL
  Filled 2015-01-01 (×14): qty 1

## 2015-01-01 MED ORDER — CLONIDINE HCL 0.1 MG PO TABS
0.1000 mg | ORAL_TABLET | Freq: Two times a day (BID) | ORAL | Status: DC
  Administered 2015-01-01 (×2): 0.1 mg via ORAL
  Filled 2015-01-01 (×4): qty 1

## 2015-01-01 MED ORDER — ATORVASTATIN CALCIUM 40 MG PO TABS
40.0000 mg | ORAL_TABLET | Freq: Every evening | ORAL | Status: DC
  Administered 2015-01-01 – 2015-01-07 (×6): 40 mg via ORAL
  Filled 2015-01-01 (×8): qty 1

## 2015-01-01 MED ORDER — SUMATRIPTAN SUCCINATE 6 MG/0.5ML ~~LOC~~ SOLN
6.0000 mg | Freq: Once | SUBCUTANEOUS | Status: AC
  Administered 2015-01-01: 6 mg via SUBCUTANEOUS
  Filled 2015-01-01: qty 0.5

## 2015-01-01 MED ORDER — HYDROMORPHONE HCL 1 MG/ML IJ SOLN
1.0000 mg | Freq: Once | INTRAMUSCULAR | Status: AC
Start: 2015-01-01 — End: 2015-01-01
  Administered 2015-01-01: 1 mg via INTRAVENOUS
  Filled 2015-01-01 (×2): qty 1

## 2015-01-01 MED ORDER — SUMATRIPTAN SUCCINATE 6 MG/0.5ML ~~LOC~~ SOLN
6.0000 mg | Freq: Two times a day (BID) | SUBCUTANEOUS | Status: DC | PRN
  Administered 2015-01-02 (×2): 6 mg via SUBCUTANEOUS
  Filled 2015-01-01 (×4): qty 0.5

## 2015-01-01 NOTE — Progress Notes (Signed)
Noted that patient was started on Lantus 5 units daily. Recommend adding Novolog SENSITIVE correction scale TID & HS while in the hospital. Harvel Ricks RN BSN CDE

## 2015-01-01 NOTE — Progress Notes (Signed)
[place] TEAM 1 - Stepdown/ICU TEAM Progress Note  Adriana Tucker DOB: June 25, 1981 DOA: 12/29/2014 PCP: Pcp Not In System  Admit HPI / Brief Narrative: Adriana Tucker is a 34 y.o. 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 Wound up with cerebellar strokes initially patient was locked-in; however, very luckily with a large amount of physical therapy has regained near total R sided strength, still has chronic left sided weakness at baseline now.  Patient presents to the ED with gradual onset, severe diffuse headache. Symptoms onset at 9 AM this morning and progressively worsening. She is visiting the area from [place]. Her L sided weakness from prior aneurysm related stroke is unchanged. Her BP is elevated on arrival to ED despite compliance with medications. 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   HPI/Subjective: 4/7 A/O 4, no complaints this a.m.   Assessment/Plan: Hypertensive urgency with h/o cerebral aneurysm repair  -CT/MRI negative for acute CVA -Clonidine 0.1 mg BID -Furosemide 20 mg daily -Continue metoprolol XL 75 mg BID -Hold enalapril secondary to chronic kidney disease  -Increase Hydralazine 75 mg TID -Isosorbide dinitrate 5 mgTID  Acute on Chronic kidney disease (baseline 2.1 according to records from [place] in December 2015) -Secondary to lupus -See hypertensive urgency -Hold all nephrotoxic medication -Patient's kidney function has slightly declined with a Cr= 3.05 today  DM type 2 controlled - Hemoglobin A1c = 6.2 -Secondary to patient's poor renal function a better choice would be insulin; no need to adjust for renal function. - Continue Lantus 5 units daily  HLD -Lipid panel not within ADA/AHA guidelines -Increase Lipitor 40 mg daily.   Migraine headache -Trigger is elevated BP -Resolved    Code Status:  FULL Family Communication: family present at time of exam Disposition Plan: Resolution hypertensive urgency    Consultants: NA   Procedure/Significant Events: 4/4 CT head without contrast;. Bilateral cerebellar infarcts, Lt> Rt. Several of these lesions appear remote. -Age indeterminate 7 mm right splenic infarct. Also within the posterior circulation territory. - No evidence for acute/ remote ischemia within the anterior circulation.  4/5 MRI/MRI head/brain:-No acute intracranial infarct or other abnormality identified. -Multiple remote bilateral cerebellar infarcts, Lt> Rt  with additional small remote lacunar infarct within the right thalamus. -Empty sella. MRA HEAD; Unremarkable    Culture NA  Antibiotics: NA  DVT prophylaxis: SCD   Devices NA   LINES / TUBES:  NA    Continuous Infusions:   Objective: VITAL SIGNS: Temp: 97.3 F (36.3 C) (04/07 0409) Temp Source: Oral (04/07 0409) BP: 199/123 mmHg (04/07 0530) Pulse Rate: 71 (04/07 0520) SPO2; FIO2:   Intake/Output Summary (Last 24 hours) at 01/01/15 0744 Last data filed at 12/31/14 1648  Gross per 24 hour  Intake    802 ml  Output      0 ml  Net    802 ml     Exam: General: A/O 4, NAD, No acute respiratory distress Lungs: Clear to auscultation bilaterally without wheezes or crackles Cardiovascular: Regular rate and rhythm without murmur gallop or rub normal S1 and S2 Abdomen: Nontender, nondistended, soft, bowel sounds positive, no rebound, no ascites, no appreciable mass Extremities: No significant cyanosis, clubbing, or edema bilateral lower extremities  Data Reviewed: Basic Metabolic Panel:  Recent Labs Lab 12/29/14 1549 12/29/14 1556 12/30/14 0958 01/01/15 0348  NA 142 142 140 140  K 4.2 4.3 3.9 4.6  CL 109 110 109 112  CO2 22  --  24 20  GLUCOSE 134* 138* 125* 214*  BUN 45* 39* 40* 48*  CREATININE 2.50* 2.60* 2.69* 3.05*  CALCIUM 8.7  --  8.1* 8.5  PHOS  --   --   --  3.8    Liver Function Tests:  Recent Labs Lab 12/29/14 1549 12/30/14 0958 01/01/15 0348  AST 26 19  --   ALT 21 17  --   ALKPHOS 47 42  --   BILITOT 0.8 0.5  --   PROT 7.1 5.8*  --   ALBUMIN 3.2* 2.6* 2.7*   No results for input(s): LIPASE, AMYLASE in the last 168 hours. No results for input(s): AMMONIA in the last 168 hours. CBC:  Recent Labs Lab 12/29/14 1549 12/29/14 1556 12/30/14 0958  WBC 3.3*  --  3.3*  NEUTROABS 2.3  --  2.3  HGB 9.4* 10.5* 8.6*  HCT 30.6* 31.0* 27.7*  MCV 83.6  --  83.4  PLT 147*  --  139*   Cardiac Enzymes: No results for input(s): CKTOTAL, CKMB, CKMBINDEX, TROPONINI in the last 168 hours. BNP (last 3 results) No results for input(s): BNP in the last 8760 hours.  ProBNP (last 3 results) No results for input(s): PROBNP in the last 8760 hours.  CBG:  Recent Labs Lab 12/29/14 1540 12/30/14 2104 12/31/14 2205  GLUCAP 128* 137* 169*    Recent Results (from the past 240 hour(s))  MRSA PCR Screening     Status: None   Collection Time: 12/29/14 11:36 PM  Result Value Ref Range Status   MRSA by PCR NEGATIVE NEGATIVE Final    Comment:        The GeneXpert MRSA Assay (FDA approved for NASAL specimens only), is one component of a comprehensive MRSA colonization surveillance program. It is not intended to diagnose MRSA infection nor to guide or monitor treatment for MRSA infections.      Studies:  Recent x-ray studies have been reviewed in detail by the Attending Physician  Scheduled Meds:  Scheduled Meds: . atorvastatin  20 mg Oral QPM  . buPROPion  300 mg Oral Daily  . cloNIDine  0.1 mg Oral BID  . furosemide  20 mg Intravenous Daily  . gabapentin  400 mg Oral TID  . hydrALAZINE  50 mg Oral 3 times per day  . insulin glargine  5 Units Subcutaneous QHS  . isosorbide dinitrate  5 mg Oral TID  . metoprolol succinate  50 mg Oral BID  . multivitamin with minerals  1 tablet Oral Daily  . mycophenolate  1,000 mg Oral Q12H  .  oxybutynin  10 mg Oral QHS  . predniSONE  10 mg Oral BID WC    Time spent on care of this patient: 40 mins   WOODS, Geraldo Docker , MD  [facility] Office  [phone] Pager [phone]  On-Call/Text Page:      [email or web address]      password [email or web address]  If 7PM-7AM, please contact night-coverage [email or web address] Password [email or web address] 01/01/2015, 7:44 AM   LOS: 3 days   Care during the described time interval was provided by me .  I have reviewed this patient's available data, including medical history, events of note, physical examination, radiology studies and test results as part of my evaluation  Dia Crawford, MD [phone] Pager

## 2015-01-02 ENCOUNTER — Inpatient Hospital Stay (HOSPITAL_COMMUNITY): Payer: BLUE CROSS/BLUE SHIELD

## 2015-01-02 DIAGNOSIS — I607 Nontraumatic subarachnoid hemorrhage from unspecified intracranial artery: Secondary | ICD-10-CM | POA: Diagnosis present

## 2015-01-02 DIAGNOSIS — M3214 Glomerular disease in systemic lupus erythematosus: Secondary | ICD-10-CM | POA: Diagnosis present

## 2015-01-02 DIAGNOSIS — I609 Nontraumatic subarachnoid hemorrhage, unspecified: Secondary | ICD-10-CM

## 2015-01-02 DIAGNOSIS — G43011 Migraine without aura, intractable, with status migrainosus: Secondary | ICD-10-CM | POA: Diagnosis present

## 2015-01-02 LAB — CBC
HCT: 32.6 % — ABNORMAL LOW (ref 36.0–46.0)
Hemoglobin: 10 g/dL — ABNORMAL LOW (ref 12.0–15.0)
MCH: 25.9 pg — ABNORMAL LOW (ref 26.0–34.0)
MCHC: 30.7 g/dL (ref 30.0–36.0)
MCV: 84.5 fL (ref 78.0–100.0)
Platelets: 182 K/uL (ref 150–400)
RBC: 3.86 MIL/uL — ABNORMAL LOW (ref 3.87–5.11)
RDW: 15.7 % — ABNORMAL HIGH (ref 11.5–15.5)
WBC: 5.2 K/uL (ref 4.0–10.5)

## 2015-01-02 LAB — COMPREHENSIVE METABOLIC PANEL
ALT: 17 U/L (ref 0–35)
ANION GAP: 10 (ref 5–15)
AST: 16 U/L (ref 0–37)
Albumin: 3.1 g/dL — ABNORMAL LOW (ref 3.5–5.2)
Alkaline Phosphatase: 49 U/L (ref 39–117)
BUN: 50 mg/dL — AB (ref 6–23)
CALCIUM: 9.4 mg/dL (ref 8.4–10.5)
CO2: 21 mmol/L (ref 19–32)
Chloride: 107 mmol/L (ref 96–112)
Creatinine, Ser: 2.67 mg/dL — ABNORMAL HIGH (ref 0.50–1.10)
GFR, EST AFRICAN AMERICAN: 26 mL/min — AB (ref >90)
GFR, EST NON AFRICAN AMERICAN: 22 mL/min — AB (ref >90)
GLUCOSE: 105 mg/dL — AB (ref 70–99)
Potassium: 4.5 mmol/L (ref 3.5–5.1)
Sodium: 138 mmol/L (ref 135–145)
Total Bilirubin: 0.5 mg/dL (ref 0.3–1.2)
Total Protein: 7.1 g/dL (ref 6.0–8.3)

## 2015-01-02 LAB — TROPONIN I: Troponin I: <0.03 ng/mL (ref <0.031)

## 2015-01-02 LAB — SEDIMENTATION RATE: Sed Rate: 38 mm/h — ABNORMAL HIGH (ref 0–22)

## 2015-01-02 MED ORDER — PROCHLORPERAZINE EDISYLATE 5 MG/ML IJ SOLN
10.0000 mg | Freq: Three times a day (TID) | INTRAMUSCULAR | Status: DC
  Administered 2015-01-02 – 2015-01-03 (×3): 10 mg via INTRAVENOUS
  Filled 2015-01-02 (×6): qty 2

## 2015-01-02 MED ORDER — DIPHENHYDRAMINE HCL 50 MG PO CAPS
100.0000 mg | ORAL_CAPSULE | Freq: Every day | ORAL | Status: DC
  Administered 2015-01-04: 100 mg via ORAL
  Filled 2015-01-02 (×4): qty 2

## 2015-01-02 MED ORDER — HYDRALAZINE HCL 50 MG PO TABS
100.0000 mg | ORAL_TABLET | Freq: Three times a day (TID) | ORAL | Status: DC
  Administered 2015-01-02 – 2015-01-08 (×18): 100 mg via ORAL
  Filled 2015-01-02 (×22): qty 2

## 2015-01-02 MED ORDER — OXYBUTYNIN CHLORIDE 5 MG PO TABS
2.5000 mg | ORAL_TABLET | Freq: Two times a day (BID) | ORAL | Status: DC
  Administered 2015-01-02 – 2015-01-03 (×3): 2.5 mg via ORAL
  Filled 2015-01-02 (×5): qty 0.5

## 2015-01-02 MED ORDER — HYDRALAZINE HCL 20 MG/ML IJ SOLN
10.0000 mg | Freq: Once | INTRAMUSCULAR | Status: AC
  Administered 2015-01-02: 10 mg via INTRAVENOUS
  Filled 2015-01-02: qty 1

## 2015-01-02 MED ORDER — CLONIDINE HCL 0.2 MG PO TABS
0.2000 mg | ORAL_TABLET | Freq: Two times a day (BID) | ORAL | Status: DC
  Administered 2015-01-02 – 2015-01-05 (×7): 0.2 mg via ORAL
  Filled 2015-01-02 (×8): qty 1

## 2015-01-02 MED ORDER — HYDRALAZINE HCL 25 MG PO TABS
25.0000 mg | ORAL_TABLET | Freq: Once | ORAL | Status: AC
  Administered 2015-01-02: 25 mg via ORAL
  Filled 2015-01-02: qty 1

## 2015-01-02 NOTE — Progress Notes (Signed)
[place] TEAM 1 - Stepdown/ICU TEAM Progress Note  Myka Hitz RCB:[id number] DOB: Feb 24, 1981 DOA: 12/29/2014 PCP: Pcp Not In System  Admit HPI / Brief Narrative: Adriana Tucker is a 34 y.o. 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 Wound up with cerebellar strokes initially patient was locked-in; however, very luckily with a large amount of physical therapy has regained near total R sided strength, still has chronic left sided weakness at baseline now.  Patient presents to the ED with gradual onset, severe diffuse headache. Symptoms onset at 9 AM this morning and progressively worsening. She is visiting the area from [place]. Her L sided weakness from prior aneurysm related stroke is unchanged. Her BP is elevated on arrival to ED despite compliance with medications. 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   HPI/Subjective: 4/8 A/O 4, states headache has returned with a vengeance rating of 10/10 with positive N/V, positive photophobia. States headache most severe over her right elbow area. Negative jaw claudication   Assessment/Plan: Hypertensive urgency with h/o cerebral aneurysm repair  -CT/MRI negative for acute CVA -Clonidine 0.2 mg BID -Furosemide 20 mg daily -Continue metoprolol XL 100 mg BID -Hold enalapril secondary to chronic kidney disease  -Increase Hydralazine 100 mg TID -Isosorbide dinitrate 5 mgTID  Acute on Chronic kidney disease (baseline 2.1 according to records from [place] in December 2015) -Secondary to lupus -See hypertensive urgency -Hold all nephrotoxic medication -Patient's kidney function has slightly declined with a Cr= ??  Lupus nephritis -See acute on chronic kidney disease -ESR/CRP pending -0800 left message with Dr Susa Loffler (rheumatologist), that patient sees in [place]  DM type 2 controlled -  Hemoglobin A1c = 6.2 -Secondary to patient's poor renal function a better choice would be insulin; no need to adjust for renal function. - Continue Lantus 5 units daily  HLD -Lipid panel not within ADA/AHA guidelines -Continue Lipitor 40 mg daily.   Migraine headache/status migrainous -ESR, CRP pending; temporal arteritis possibility however patient not in the normal age group.  -Refractory headache. Imitrex subcutaneous provides some relief however pain returns within a couple hours, Also no resolution with better BP control.  -Patient unable to have Toradol protocol secondary to chronic kidney failure, or DHEA protocol secondary to uncontrolled/difficult to control BP -Start IV Prochlorperazine protocol   IV prochlorperazine 5 to 10 mg every eight hours, with the dose adjusted up to a maximum of 10 mg every six hours (for no longer than two to three days at the maximum dose)  -PO diphenhydramine (100 mg) was given before sleep to prevent prochlorperazine-related extrapyramidal symptoms  -Taper off Prochlorperazine by one dose a day if the patient is headache-free or has minimal headache for >24 hours. -NOTE; if no resolution with this protocol will consult neurology -4/8 head CT; negative for acute findings.     Code Status: FULL Family Communication: no family present at time of exam Disposition Plan: Resolution hypertensive urgency    Consultants: NA   Procedure/Significant Events: 4/4 CT head without contrast;. Bilateral cerebellar infarcts, Lt> Rt. Several of these lesions appear remote. -Age indeterminate 7 mm right splenic infarct. Also within the posterior circulation territory. - No evidence for acute/ remote ischemia within the anterior circulation.  4/5 MRI/MRI head/brain:-No acute intracranial infarct or other abnormality identified. -Multiple remote bilateral cerebellar infarcts, Lt> Rt  with additional small remote lacunar  infarct within the right thalamus. -Empty  sella. MRA HEAD; Unremarkable  4/8 CT head without contrast;- Stable appearance of multiple old bilateral cerebellar infarcts/small remote lacunar infarcts of the right thalamus and right lentiform nucleus.  -No acute intracranial findings. -Partially empty sella.   Culture NA  Antibiotics: NA  DVT prophylaxis: SCD   Devices NA   LINES / TUBES:  NA    Continuous Infusions:   Objective: VITAL SIGNS: Temp: 98.2 F (36.8 C) (04/08 0857) Temp Source: Oral (04/08 0857) BP: 144/72 mmHg (04/08 0857) Pulse Rate: 88 (04/08 0857) SPO2; FIO2:  No intake or output data in the 24 hours ending 01/02/15 1208   Exam: General: A/O 4, moderate to severe distress secondary to refractory headache, No acute respiratory distress Lungs: Clear to auscultation bilaterally without wheezes or crackles Cardiovascular: Regular rate and rhythm without murmur gallop or rub normal S1 and S2 Abdomen: Nontender, nondistended, soft, bowel sounds positive, no rebound, no ascites, no appreciable mass Extremities: No significant cyanosis, clubbing, or edema bilateral lower extremities Neurologic: Cranial nerves II through XII intact, pupils equal round reactive to light and accommodation, tongue/uvula midline, left sided hemiparesis (baseline per patient), RU E/RLE strength 5/5, sensation intact, pain to palpation over right temporal area, negative jaw claudication.  Data Reviewed: Basic Metabolic Panel:  Recent Labs Lab 12/29/14 1549 12/29/14 1556 12/30/14 0958 01/01/15 0348  NA 142 142 140 140  K 4.2 4.3 3.9 4.6  CL 109 110 109 112  CO2 22  --  24 20  GLUCOSE 134* 138* 125* 214*  BUN 45* 39* 40* 48*  CREATININE 2.50* 2.60* 2.69* 3.05*  CALCIUM 8.7  --  8.1* 8.5  PHOS  --   --   --  3.8   Liver Function Tests:  Recent Labs Lab 12/29/14 1549 12/30/14 0958 01/01/15 0348  AST 26 19  --   ALT 21 17  --   ALKPHOS 47 42  --   BILITOT 0.8 0.5  --   PROT 7.1 5.8*  --   ALBUMIN 3.2*  2.6* 2.7*   No results for input(s): LIPASE, AMYLASE in the last 168 hours. No results for input(s): AMMONIA in the last 168 hours. CBC:  Recent Labs Lab 12/29/14 1549 12/29/14 1556 12/30/14 0958  WBC 3.3*  --  3.3*  NEUTROABS 2.3  --  2.3  HGB 9.4* 10.5* 8.6*  HCT 30.6* 31.0* 27.7*  MCV 83.6  --  83.4  PLT 147*  --  139*   Cardiac Enzymes: No results for input(s): CKTOTAL, CKMB, CKMBINDEX, TROPONINI in the last 168 hours. BNP (last 3 results) No results for input(s): BNP in the last 8760 hours.  ProBNP (last 3 results) No results for input(s): PROBNP in the last 8760 hours.  CBG:  Recent Labs Lab 12/29/14 1540 12/30/14 2104 12/31/14 2205  GLUCAP 128* 137* 169*    Recent Results (from the past 240 hour(s))  MRSA PCR Screening     Status: None   Collection Time: 12/29/14 11:36 PM  Result Value Ref Range Status   MRSA by PCR NEGATIVE NEGATIVE Final    Comment:        The GeneXpert MRSA Assay (FDA approved for NASAL specimens only), is one component of a comprehensive MRSA colonization surveillance program. It is not intended to diagnose MRSA infection nor to guide or monitor treatment for MRSA infections.   Culture, Urine     Status: None   Collection Time: 12/31/14  8:08 PM  Result Value  Ref Range Status   Specimen Description URINE, CLEAN CATCH  Final   Special Requests NONE  Final   Colony Count NO GROWTH Performed at [facility]   Final   Culture NO GROWTH Performed at [facility]   Final   Report Status 01/01/2015 FINAL  Final     Studies:  Recent x-ray studies have been reviewed in detail by the Attending Physician  Scheduled Meds:  Scheduled Meds: . atorvastatin  40 mg Oral QPM  . buPROPion  300 mg Oral Daily  . cloNIDine  0.2 mg Oral BID  . diphenhydrAMINE  100 mg Oral QHS  . furosemide  20 mg Intravenous Daily  . gabapentin  400 mg Oral TID  . hydrALAZINE  100 mg Oral 3 times per day  . insulin glargine  5 Units  Subcutaneous QHS  . isosorbide dinitrate  5 mg Oral TID  . metoprolol succinate  100 mg Oral BID  . multivitamin with minerals  1 tablet Oral Daily  . mycophenolate  1,000 mg Oral Q12H  . oxybutynin  2.5 mg Oral BID  . predniSONE  10 mg Oral BID WC  . prochlorperazine  10 mg Intravenous 3 times per day    Time spent on care of this patient: 40 mins   WOODS, Geraldo Docker , MD  [facility] Office  [phone] Pager - [phone]  On-Call/Text Page:      [email or web address]      password [email or web address]  If 7PM-7AM, please contact night-coverage [email or web address] Password [email or web address] 01/02/2015, 12:08 PM   LOS: 4 days   Care during the described time interval was provided by me .  I have reviewed this patient's available data, including medical history, events of note, physical examination, radiology studies and test results as part of my evaluation  Dia Crawford, MD [phone] Pager

## 2015-01-02 NOTE — Progress Notes (Signed)
In to see patient at this time. Patient currently reports increasing migraine headache to bilateral temples. The patient reports photosensitivity, and associated nausea. MD Sherral Hammers notified at this time. BP 169/124 at this time, 10mg  Labetalol given IVP. Status post admisntration patient bp 0900 119/100. MD to come to bedside to assess patient and updated about patient complaint of bilateral temple pain, and bp . New orders for STAT CT Scan and EKG, ok to give prn percocet and hold po clonidine and hydralazine due to decreased bp. Will continue to monitor th patient closely.

## 2015-01-02 NOTE — Progress Notes (Signed)
[id number] Patient blood pressure elevated 190/116 prn dose of labetalol 10 mg IV given and scheduled dose of hydralazine  75mg  po given.[id number] Blood pressure remains elevated 178/107.Text paged Forrest Moron NP new order received 10 mg IV  Hydralazine one time ordered and given .Will continue to monitor patient.

## 2015-01-03 DIAGNOSIS — M329 Systemic lupus erythematosus, unspecified: Secondary | ICD-10-CM | POA: Diagnosis present

## 2015-01-03 DIAGNOSIS — R339 Retention of urine, unspecified: Secondary | ICD-10-CM | POA: Diagnosis present

## 2015-01-03 LAB — GLUCOSE, CAPILLARY
Glucose-Capillary: 162 mg/dL — ABNORMAL HIGH (ref 70–99)
Glucose-Capillary: 162 mg/dL — ABNORMAL HIGH (ref 70–99)

## 2015-01-03 LAB — TROPONIN I

## 2015-01-03 LAB — C-REACTIVE PROTEIN: CRP: <0.5 mg/dL — ABNORMAL LOW (ref <0.60)

## 2015-01-03 MED ORDER — TOLTERODINE TARTRATE 2 MG PO TABS: 4.0000 mg | ORAL_TABLET | Freq: Two times a day (BID) | ORAL | Status: DC

## 2015-01-03 MED ORDER — OXYCODONE-ACETAMINOPHEN 5-325 MG PO TABS
1.0000 | ORAL_TABLET | Freq: Three times a day (TID) | ORAL | Status: DC | PRN
  Administered 2015-01-03: 1 via ORAL

## 2015-01-03 MED ORDER — OXYCODONE-ACETAMINOPHEN 5-325 MG PO TABS
2.0000 | ORAL_TABLET | Freq: Two times a day (BID) | ORAL | Status: DC | PRN
Start: 2015-01-03 — End: 2015-01-08
  Administered 2015-01-04 – 2015-01-07 (×6): 2 via ORAL
  Filled 2015-01-03 (×7): qty 2

## 2015-01-03 MED ORDER — PREDNISONE 20 MG PO TABS
20.0000 mg | ORAL_TABLET | Freq: Once | ORAL | Status: AC
  Administered 2015-01-03: 20 mg via ORAL
  Filled 2015-01-03: qty 1

## 2015-01-03 MED ORDER — PROCHLORPERAZINE EDISYLATE 5 MG/ML IJ SOLN
10.0000 mg | Freq: Two times a day (BID) | INTRAMUSCULAR | Status: DC
  Administered 2015-01-03 – 2015-01-04 (×2): 10 mg via INTRAVENOUS
  Filled 2015-01-03 (×3): qty 2

## 2015-01-03 MED ORDER — OXYCODONE-ACETAMINOPHEN 5-325 MG PO TABS: 1.0000 | ORAL_TABLET | Freq: Two times a day (BID) | ORAL | Status: DC | PRN

## 2015-01-03 MED ORDER — PREDNISONE 20 MG PO TABS
30.0000 mg | ORAL_TABLET | Freq: Two times a day (BID) | ORAL | Status: DC
  Administered 2015-01-03 – 2015-01-05 (×5): 30 mg via ORAL
  Filled 2015-01-03 (×6): qty 1

## 2015-01-03 MED ORDER — ISOSORBIDE DINITRATE 5 MG PO TABS
7.5000 mg | ORAL_TABLET | Freq: Three times a day (TID) | ORAL | Status: DC
  Administered 2015-01-03 – 2015-01-05 (×7): 7.5 mg via ORAL
  Filled 2015-01-03 (×8): qty 2

## 2015-01-03 MED ORDER — TOLTERODINE TARTRATE 2 MG PO TABS
4.0000 mg | ORAL_TABLET | Freq: Two times a day (BID) | ORAL | Status: DC
  Administered 2015-01-03 – 2015-01-08 (×10): 4 mg via ORAL
  Filled 2015-01-03 (×10): qty 2

## 2015-01-03 NOTE — Progress Notes (Addendum)
[place] TEAM 1 - Stepdown/ICU TEAM Progress Note  Adrianah Prophete KGM:[id number] DOB: [birth year] DOA: 12/29/2014 PCP: Pcp Not In System  Admit HPI / Brief Narrative: Adriana Tucker is a 34 y.o. 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 Wound up with cerebellar strokes initially patient was locked-in; however, very luckily with a large amount of physical therapy has regained near total R sided strength, still has chronic left sided weakness at baseline now.  Patient presents to the ED with gradual onset, severe diffuse headache. Symptoms onset at 9 AM this morning and progressively worsening. She is visiting the area from [place]. Her L sided weakness from prior aneurysm related stroke is unchanged. Her BP is elevated on arrival to ED despite compliance with medications. 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   HPI/Subjective: 4/9 A/O 4, states headache has resolved. Only complaint is positive fatigue, and lupus pain in her right hand and fingers/bilateral knees.    Assessment/Plan: Hypertensive urgency with h/o cerebral aneurysm repair  -CT/MRI negative for acute CVA -Clonidine 0.2 mg BID -Furosemide 20 mg daily -Continue metoprolol XL 100 mg BID -Hold enalapril secondary to chronic kidney disease  -Increase Hydralazine 100 mg TID -Isosorbide dinitrate 7.5 mgTID  Acute on Chronic kidney disease (baseline 2.1 according to records from [place] in December 2015) -Secondary to lupus -See hypertensive urgency -Hold all nephrotoxic medication -Patient's kidney function has slightly declined with a Cr= 2.67  Lupus nephritis -See acute on chronic kidney disease -ESR/CRP elevated -4/8 ;  0800 left message with Dr Susa Loffler (rheumatologist), that patient sees in [place] -4/9 Dr Susa Loffler (rheumatologist), did not return  call.  Lupus exacerbation  -Increase prednisone to 30 mg BID  Urinary retention -Spoke w/ Pharmacist Tigan who has located patient's home Detrol -Detrol 4 mg BID  DM type 2 controlled - Hemoglobin A1c = 6.2 -Secondary to patient's poor renal function a better choice would be insulin; no need to adjust for renal function. - Continue Lantus 5 units daily -Start sensitive SSI (giving patient steroid pulse)  HLD -Lipid panel not within ADA/AHA guidelines -Continue Lipitor 40 mg daily.   Migraine headache/status migrainous -ESR, CRP elevated, however not worrisome for temporal arteritis  -Refractory headache. Imitrex subcutaneous provides some relief however pain returns within a couple hours, Also no resolution with better BP control.  -Patient unable to have Toradol protocol secondary to chronic kidney failure, or DHEA protocol secondary to uncontrolled/difficult to control BP -Titrating IV Prochlorperazine protocol; Patient headache free; begin titrating medication down.  IV prochlorperazine 5 to 10 mg every eight hours, with the dose adjusted up to a maximum of 10 mg every six hours (for no longer than two to three days at the maximum dose)  -Continue PO diphenhydramine (100 mg) QHS to prevent prochlorperazine-related extrapyramidal symptoms  -Taper off Prochlorperazine by one dose a day if the patient is headache-free or has minimal headache for >24 hours. -4/9 decrease IV Prochlorperazine to 10 mg BID -4/8 head CT; negative for acute findings.     Code Status: FULL Family Communication: no family present at time of exam Disposition Plan: Resolution hypertensive urgency    Consultants: NA   Procedure/Significant Events: 4/4 CT head without contrast;. Bilateral cerebellar infarcts, Lt> Rt. Several of these lesions appear remote. -Age indeterminate 7 mm right splenic infarct. Also within the posterior circulation  territory. - No evidence for acute/ remote ischemia within  the anterior circulation.  4/5 MRI/MRI head/brain:-No acute intracranial infarct or other abnormality identified. -Multiple remote bilateral cerebellar infarcts, Lt> Rt  with additional small remote lacunar infarct within the right thalamus. -Empty sella. MRA HEAD; Unremarkable  4/8 CT head without contrast;- Stable appearance of multiple old bilateral cerebellar infarcts/small remote lacunar infarcts of the right thalamus and right lentiform nucleus.  -No acute intracranial findings. -Partially empty sella.   Culture NA  Antibiotics: NA  DVT prophylaxis: SCD   Devices NA   LINES / TUBES:  NA    Continuous Infusions:   Objective: VITAL SIGNS: Temp: 97.9 F (36.6 C) (04/09 0918) Temp Source: Oral (04/09 0918) BP: 133/88 mmHg (04/09 0918) Pulse Rate: 76 (04/09 0918) SPO2; FIO2:   Intake/Output Summary (Last 24 hours) at 01/03/15 1050 Last data filed at 01/03/15 0936  Gross per 24 hour  Intake    360 ml  Output      0 ml  Net    360 ml     Exam: General: A/O 4, NAD, No acute respiratory distress Lungs: Clear to auscultation bilaterally without wheezes or crackles Cardiovascular: Regular rate and rhythm without murmur gallop or rub normal S1 and S2 Abdomen: Nontender, nondistended, soft, bowel sounds positive, no rebound, no ascites, no appreciable mass Extremities: No significant cyanosis, clubbing, or edema bilateral lower extremities. Swelling right hand/metacarpals with tenderness to palpation at each joint, bilateral knee swelling Lt> Rt with tenderness at the joint lines (patient states this is typical lupus flare) Neurologic: Cranial nerves II through XII intact, pupils equal round reactive to light and accommodation, tongue/uvula midline, left sided hemiparesis (baseline per patient), RU E/RLE strength 5/5, sensation intact, pain to palpation over right temporal area, negative jaw claudication.  Data Reviewed: Basic Metabolic Panel:  Recent Labs Lab  12/29/14 1549 12/29/14 1556 12/30/14 0958 01/01/15 0348 01/02/15 1220  NA 142 142 140 140 138  K 4.2 4.3 3.9 4.6 4.5  CL 109 110 109 112 107  CO2 22  --  _0 GLUCOSE 134* 138* 125* 214* 105*  BUN 45* 39* 40* 48* 50*  CREATININE 2.50* 2.60* 2.69* 3.05* 2.67*  CALCIUM 8.7  --  8.1* 8.5 9.4  PHOS  --   --   --  3.8  --    Liver Function Tests:  Recent Labs Lab 12/29/14 1549 12/30/14 0958 01/01/15 0348 01/02/15 1220  AST 26 19  --  16  ALT 21 17  --  17  ALKPHOS 47 42  --  49  BILITOT 0.8 0.5  --  0.5  PROT 7.1 5.8*  --  7.1  ALBUMIN 3.2* 2.6* 2.7* 3.1*   No results for input(s): LIPASE, AMYLASE in the last 168 hours. No results for input(s): AMMONIA in the last 168 hours. CBC:  Recent Labs Lab 12/29/14 1549 12/29/14 1556 12/30/14 0958 01/02/15 1220  WBC 3.3*  --  3.3* 5.2  NEUTROABS 2.3  --  2.3  --   HGB 9.4* 10.5* 8.6* 10.0*  HCT 30.6* 31.0* 27.7* 32.6*  MCV 83.6  --  83.4 84.5  PLT 147*  --  139* 182   Cardiac Enzymes:  Recent Labs Lab 01/02/15 1220 01/02/15 1844 01/03/15 0107  TROPONINI <0.03 <0.03 <0.03   BNP (last 3 results) No results for input(s): BNP in the last 8760 hours.  ProBNP (last 3 results) No results for input(s): PROBNP in the last 8760 hours.  CBG:  Recent Labs Lab 12/29/14 1540 12/30/14 2104 12/31/14 2205 01/02/15 2102  GLUCAP 128* 137* 169* 162*    Recent Results (from the past 240 hour(s))  MRSA PCR Screening     Status: None   Collection Time: 12/29/14 11:36 PM  Result Value Ref Range Status   MRSA by PCR NEGATIVE NEGATIVE Final    Comment:        The GeneXpert MRSA Assay (FDA approved for NASAL specimens only), is one component of a comprehensive MRSA colonization surveillance program. It is not intended to diagnose MRSA infection nor to guide or monitor treatment for MRSA infections.   Culture, Urine     Status: None   Collection Time: 12/31/14  8:08 PM  Result Value Ref Range Status   Specimen  Description URINE, CLEAN CATCH  Final   Special Requests NONE  Final   Colony Count NO GROWTH Performed at [facility]   Final   Culture NO GROWTH Performed at [facility]   Final   Report Status 01/01/2015 FINAL  Final     Studies:  Recent x-ray studies have been reviewed in detail by the Attending Physician  Scheduled Meds:  Scheduled Meds: . atorvastatin  40 mg Oral QPM  . buPROPion  300 mg Oral Daily  . cloNIDine  0.2 mg Oral BID  . diphenhydrAMINE  100 mg Oral QHS  . furosemide  20 mg Intravenous Daily  . gabapentin  400 mg Oral TID  . hydrALAZINE  100 mg Oral 3 times per day  . insulin glargine  5 Units Subcutaneous QHS  . isosorbide dinitrate  5 mg Oral TID  . metoprolol succinate  100 mg Oral BID  . multivitamin with minerals  1 tablet Oral Daily  . mycophenolate  1,000 mg Oral Q12H  . oxybutynin  2.5 mg Oral BID  . predniSONE  10 mg Oral BID WC  . prochlorperazine  10 mg Intravenous 3 times per day    Time spent on care of this patient: 40 mins   Shenoa Hattabaugh, Geraldo Docker , MD  [facility] Office  [phone] Pager - [phone]  On-Call/Text Page:      [email or web address]      password [email or web address]  If 7PM-7AM, please contact night-coverage [email or web address] Password [email or web address] 01/03/2015, 10:50 AM   LOS: 5 days   Care during the described time interval was provided by me .  I have reviewed this patient's available data, including medical history, events of note, physical examination, radiology studies and test results as part of my evaluation  Dia Crawford, MD [phone] Pager

## 2015-01-04 LAB — CBC WITH DIFFERENTIAL/PLATELET
BASOS PCT: 0 % (ref 0–1)
Basophils Absolute: 0 10*3/uL (ref 0.0–0.1)
Eosinophils Absolute: 0 10*3/uL (ref 0.0–0.7)
Eosinophils Relative: 0 % (ref 0–5)
HCT: 29 % — ABNORMAL LOW (ref 36.0–46.0)
Hemoglobin: 9 g/dL — ABNORMAL LOW (ref 12.0–15.0)
LYMPHS PCT: 15 % (ref 12–46)
Lymphs Abs: 0.8 10*3/uL (ref 0.7–4.0)
MCH: 25.9 pg — ABNORMAL LOW (ref 26.0–34.0)
MCHC: 31 g/dL (ref 30.0–36.0)
MCV: 83.6 fL (ref 78.0–100.0)
MONO ABS: 0.2 10*3/uL (ref 0.1–1.0)
Monocytes Relative: 3 % (ref 3–12)
Neutro Abs: 4.1 10*3/uL (ref 1.7–7.7)
Neutrophils Relative %: 82 % — ABNORMAL HIGH (ref 43–77)
Platelets: 205 10*3/uL (ref 150–400)
RBC: 3.47 MIL/uL — ABNORMAL LOW (ref 3.87–5.11)
RDW: 15.2 % (ref 11.5–15.5)
WBC: 5 10*3/uL (ref 4.0–10.5)

## 2015-01-04 LAB — COMPREHENSIVE METABOLIC PANEL
ALK PHOS: 57 U/L (ref 39–117)
ALT: 15 U/L (ref 0–35)
AST: 20 U/L (ref 0–37)
Albumin: 2.9 g/dL — ABNORMAL LOW (ref 3.5–5.2)
Anion gap: 10 (ref 5–15)
BUN: 75 mg/dL — ABNORMAL HIGH (ref 6–23)
CO2: 19 mmol/L (ref 19–32)
Calcium: 9.1 mg/dL (ref 8.4–10.5)
Chloride: 106 mmol/L (ref 96–112)
Creatinine, Ser: 3.48 mg/dL — ABNORMAL HIGH (ref 0.50–1.10)
GFR calc Af Amer: 19 mL/min — ABNORMAL LOW (ref >90)
GFR calc non Af Amer: 16 mL/min — ABNORMAL LOW (ref >90)
GLUCOSE: 159 mg/dL — AB (ref 70–99)
Potassium: 5.5 mmol/L — ABNORMAL HIGH (ref 3.5–5.1)
Sodium: 135 mmol/L (ref 135–145)
Total Bilirubin: 0.5 mg/dL (ref 0.3–1.2)
Total Protein: 7 g/dL (ref 6.0–8.3)

## 2015-01-04 MED ORDER — PROCHLORPERAZINE EDISYLATE 5 MG/ML IJ SOLN
5.0000 mg | Freq: Two times a day (BID) | INTRAMUSCULAR | Status: DC
  Administered 2015-01-04 – 2015-01-05 (×2): 5 mg via INTRAVENOUS
  Filled 2015-01-04 (×3): qty 1

## 2015-01-04 NOTE — Progress Notes (Signed)
[facility] TEAM 1 - Stepdown/ICU TEAM Progress Note  Kamori Kitchens RUE:[id number] DOB: 12-Mar-1981 DOA: 12/29/2014 PCP: Pcp Not In System  Admit HPI / Brief Narrative: Adriana Tucker is a 34 y.o. 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 Wound up with cerebellar strokes initially patient was locked-in; however, very luckily with a large amount of physical therapy has regained near total R sided strength, still has chronic left sided weakness at baseline now.  Patient presents to the ED with gradual onset, severe diffuse headache. Symptoms onset at 9 AM this morning and progressively worsening. She is visiting the area from [place]. Her L sided weakness from prior aneurysm related stroke is unchanged. Her BP is elevated on arrival to ED despite compliance with medications. 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   HPI/Subjective: 4/10 A/O 4, states continued headache resolution. Continued  lupus pain in her right hand and fingers/bilateral knees.    Assessment/Plan: Hypertensive urgency with h/o cerebral aneurysm repair  -CT/MRI negative for acute CVA -Continue Clonidine 0.2 mg BID -Continue Furosemide 20 mg daily -Continue metoprolol XL 100 mg BID -Continue Hold enalapril secondary to chronic kidney disease  -Continue Hydralazine 100 mg TID -Continue Isosorbide dinitrate 7.5 mgTID  Acute on Chronic kidney disease (baseline 2.1 according to records from [place] in December 2015) -Secondary to lupus -See hypertensive urgency -Hold all nephrotoxic medication -Patient's kidney function has slightly declined with a Cr= 2.67  Lupus nephritis -See acute on chronic kidney disease -ESR/CRP elevated -4/8 ;  0800 left message with Dr Susa Loffler (rheumatologist), that patient sees in [place] -4/9 Dr Susa Loffler (rheumatologist), did not  return call.  Lupus exacerbation  -Continue prednisone to 30 mg BID -Swelling/pain in right hand decreased; continued bilateral knee swelling/joint line pain  Urinary retention -Spoke w/ Pharmacist Tigan who has located patient's home Detrol -Detrol 4 mg BID  DM type 2 controlled - Hemoglobin A1c = 6.2 -Secondary to patient's poor renal function a better choice would be insulin; no need to adjust for renal function. - Continue Lantus 5 units daily -Continue sensitive SSI (giving patient steroid pulse)  HLD -Lipid panel not within ADA/AHA guidelines -Continue Lipitor 40 mg daily.   Migraine headache/status migrainous -ESR, CRP elevated, however not worrisome for temporal arteritis  -Refractory headache. Imitrex subcutaneous provides some relief however pain returns within a couple hours, Also no resolution with better BP control.  -Patient unable to have Toradol protocol secondary to chronic kidney failure, or DHEA protocol secondary to uncontrolled/difficult to control BP -Titrating IV Prochlorperazine protocol; Patient headache free; begin titrating medication down.  IV prochlorperazine 5 to 10 mg every eight hours, with the dose adjusted up to a maximum of 10 mg every six hours (for no longer than two to three days at the maximum dose)  -Continue PO diphenhydramine (100 mg) QHS to prevent prochlorperazine-related extrapyramidal symptoms  -Taper off Prochlorperazine by one dose a day if the patient is headache-free or has minimal headache for >24 hours. -4/10 decrease IV Prochlorperazine to 5 mg BID. If headache free in a.m. would DC -4/8 head CT; negative for acute findings.     Code Status: FULL Family Communication: no family present at time of exam Disposition Plan: Resolution hypertensive urgency/status migrainous; DC next 24-48 hours    Consultants: NA   Procedure/Significant Events: 4/4 CT head without contrast;.  Bilateral cerebellar infarcts, Lt> Rt. Several of  these lesions appear remote. -Age indeterminate 7 mm right splenic infarct. Also within the posterior circulation territory. - No evidence for acute/ remote ischemia within the anterior circulation.  4/5 MRI/MRI head/brain:-No acute intracranial infarct or other abnormality identified. -Multiple remote bilateral cerebellar infarcts, Lt> Rt  with additional small remote lacunar infarct within the right thalamus. -Empty sella. MRA HEAD; Unremarkable  4/8 CT head without contrast;- Stable appearance of multiple old bilateral cerebellar infarcts/small remote lacunar infarcts of the right thalamus and right lentiform nucleus.  -No acute intracranial findings. -Partially empty sella.   Culture NA  Antibiotics: NA  DVT prophylaxis: SCD   Devices NA   LINES / TUBES:  NA    Continuous Infusions:   Objective: VITAL SIGNS: Temp: 97.9 F (36.6 C) (04/10 0420) Temp Source: Oral (04/10 0420) BP: 147/81 mmHg (04/10 0900) Pulse Rate: 77 (04/10 0900) SPO2; FIO2:   Intake/Output Summary (Last 24 hours) at 01/04/15 1200 Last data filed at 01/03/15 2000  Gross per 24 hour  Intake    480 ml  Output      0 ml  Net    480 ml     Exam: General: A/O 4, NAD, No acute respiratory distress Lungs: Clear to auscultation bilaterally without wheezes or crackles Cardiovascular: Regular rate and rhythm without murmur gallop or rub normal S1 and S2 Abdomen: Nontender, nondistended, soft, bowel sounds positive, no rebound, no ascites, no appreciable mass Extremities: No significant cyanosis, clubbing, or edema bilateral lower extremities. Swelling right hand/metacarpals with tenderness to palpation at each joint (beginning to resolve with steroid pulse), bilateral knee swelling Lt> Rt with tenderness at the joint lines ( typical lupus flare) Neurologic: Cranial nerves II through XII intact, pupils equal round reactive to light and accommodation, tongue/uvula midline, left sided hemiparesis  (baseline per patient), RU E/RLE strength 5/5, sensation intact, negative pain to palpation over right temporal area, negative jaw claudication.  Data Reviewed: Basic Metabolic Panel:  Recent Labs Lab 12/29/14 1549 12/29/14 1556 12/30/14 0958 01/01/15 0348 01/02/15 1220  NA 142 142 140 140 138  K 4.2 4.3 3.9 4.6 4.5  CL 109 110 109 112 107  CO2 22  --  '24 20 21  ' GLUCOSE 134* 138* 125* 214* 105*  BUN 45* 39* 40* 48* 50*  CREATININE 2.50* 2.60* 2.69* 3.05* 2.67*  CALCIUM 8.7  --  8.1* 8.5 9.4  PHOS  --   --   --  3.8  --    Liver Function Tests:  Recent Labs Lab 12/29/14 1549 12/30/14 0958 01/01/15 0348 01/02/15 1220  AST 26 19  --  16  ALT 21 17  --  17  ALKPHOS 47 42  --  49  BILITOT 0.8 0.5  --  0.5  PROT 7.1 5.8*  --  7.1  ALBUMIN 3.2* 2.6* 2.7* 3.1*   No results for input(s): LIPASE, AMYLASE in the last 168 hours. No results for input(s): AMMONIA in the last 168 hours. CBC:  Recent Labs Lab 12/29/14 1549 12/29/14 1556 12/30/14 0958 01/02/15 1220  WBC 3.3*  --  3.3* 5.2  NEUTROABS 2.3  --  2.3  --   HGB 9.4* 10.5* 8.6* 10.0*  HCT 30.6* 31.0* 27.7* 32.6*  MCV 83.6  --  83.4 84.5  PLT 147*  --  139* 182   Cardiac Enzymes:  Recent Labs Lab 01/02/15 1220 01/02/15 1844 01/03/15 0107  TROPONINI <0.03 <0.03 <0.03   BNP (last 3 results)  No results for input(s): BNP in the last 8760 hours.  ProBNP (last 3 results) No results for input(s): PROBNP in the last 8760 hours.  CBG:  Recent Labs Lab 12/29/14 1540 12/30/14 2104 12/31/14 2205 01/02/15 2102 01/03/15 2118  GLUCAP 128* 137* 169* 162* 162*    Recent Results (from the past 240 hour(s))  MRSA PCR Screening     Status: None   Collection Time: 12/29/14 11:36 PM  Result Value Ref Range Status   MRSA by PCR NEGATIVE NEGATIVE Final    Comment:        The GeneXpert MRSA Assay (FDA approved for NASAL specimens only), is one component of a comprehensive MRSA colonization surveillance  program. It is not intended to diagnose MRSA infection nor to guide or monitor treatment for MRSA infections.   Culture, Urine     Status: None   Collection Time: 12/31/14  8:08 PM  Result Value Ref Range Status   Specimen Description URINE, CLEAN CATCH  Final   Special Requests NONE  Final   Colony Count NO GROWTH Performed at [facility]   Final   Culture NO GROWTH Performed at [facility]   Final   Report Status 01/01/2015 FINAL  Final     Studies:  Recent x-ray studies have been reviewed in detail by the Attending Physician  Scheduled Meds:  Scheduled Meds: . atorvastatin  40 mg Oral QPM  . buPROPion  300 mg Oral Daily  . cloNIDine  0.2 mg Oral BID  . diphenhydrAMINE  100 mg Oral QHS  . furosemide  20 mg Intravenous Daily  . gabapentin  400 mg Oral TID  . hydrALAZINE  100 mg Oral 3 times per day  . insulin glargine  5 Units Subcutaneous QHS  . isosorbide dinitrate  7.5 mg Oral TID  . metoprolol succinate  100 mg Oral BID  . multivitamin with minerals  1 tablet Oral Daily  . mycophenolate  1,000 mg Oral Q12H  . predniSONE  30 mg Oral BID WC  . prochlorperazine  5 mg Intravenous BID  . tolterodine  4 mg Oral BID    Time spent on care of this patient: 40 mins   WOODS, Geraldo Docker , MD  [facility] Office  [phone] Pager - [phone]  On-Call/Text Page:      [email or web address]      password [email or web address]  If 7PM-7AM, please contact night-coverage [email or web address] Password [email or web address] 01/04/2015, 12:00 PM   LOS: 6 days   Care during the described time interval was provided by me .  I have reviewed this patient's available data, including medical history, events of note, physical examination, radiology studies and test results as part of my evaluation  Dia Crawford, MD [phone] Pager

## 2015-01-05 LAB — COMPREHENSIVE METABOLIC PANEL
ALT: 14 U/L (ref 0–35)
AST: 21 U/L (ref 0–37)
Albumin: 3 g/dL — ABNORMAL LOW (ref 3.5–5.2)
Alkaline Phosphatase: 52 U/L (ref 39–117)
Anion gap: 8 (ref 5–15)
BILIRUBIN TOTAL: 0.6 mg/dL (ref 0.3–1.2)
BUN: 81 mg/dL — AB (ref 6–23)
CO2: 21 mmol/L (ref 19–32)
Calcium: 9.1 mg/dL (ref 8.4–10.5)
Chloride: 107 mmol/L (ref 96–112)
Creatinine, Ser: 3.69 mg/dL — ABNORMAL HIGH (ref 0.50–1.10)
GFR calc Af Amer: 17 mL/min — ABNORMAL LOW (ref >90)
GFR, EST NON AFRICAN AMERICAN: 15 mL/min — AB (ref >90)
Glucose, Bld: 176 mg/dL — ABNORMAL HIGH (ref 70–99)
POTASSIUM: 5.5 mmol/L — AB (ref 3.5–5.1)
Sodium: 136 mmol/L (ref 135–145)
Total Protein: 6.9 g/dL (ref 6.0–8.3)

## 2015-01-05 MED ORDER — ISOSORBIDE DINITRATE 10 MG PO TABS
10.0000 mg | ORAL_TABLET | Freq: Three times a day (TID) | ORAL | Status: DC
  Administered 2015-01-05 – 2015-01-06 (×2): 10 mg via ORAL
  Filled 2015-01-05 (×4): qty 1

## 2015-01-05 MED ORDER — PREDNISONE 10 MG PO TABS
10.0000 mg | ORAL_TABLET | Freq: Two times a day (BID) | ORAL | Status: DC
  Administered 2015-01-06 – 2015-01-08 (×5): 10 mg via ORAL
  Filled 2015-01-05 (×7): qty 1

## 2015-01-05 MED ORDER — MYCOPHENOLATE MOFETIL 250 MG PO CAPS
1000.0000 mg | ORAL_CAPSULE | Freq: Two times a day (BID) | ORAL | Status: DC
  Administered 2015-01-05 – 2015-01-08 (×6): 1000 mg via ORAL
  Filled 2015-01-05 (×7): qty 4

## 2015-01-05 MED ORDER — ISOSORBIDE DINITRATE 5 MG PO TABS: 7.5000 mg | ORAL_TABLET | Freq: Three times a day (TID) | ORAL | Status: DC

## 2015-01-05 MED ORDER — HYDRALAZINE HCL 100 MG PO TABS: 100.0000 mg | ORAL_TABLET | Freq: Three times a day (TID) | ORAL | Status: DC

## 2015-01-05 MED ORDER — FUROSEMIDE 20 MG PO TABS: 20.0000 mg | ORAL_TABLET | Freq: Every day | ORAL | Status: DC

## 2015-01-05 MED ORDER — METOPROLOL SUCCINATE ER 100 MG PO TB24: 100.0000 mg | ORAL_TABLET | Freq: Two times a day (BID) | ORAL | Status: AC

## 2015-01-05 MED ORDER — FUROSEMIDE 10 MG/ML IJ SOLN
40.0000 mg | Freq: Two times a day (BID) | INTRAMUSCULAR | Status: DC
  Administered 2015-01-05 – 2015-01-06 (×2): 40 mg via INTRAVENOUS
  Filled 2015-01-05 (×3): qty 4

## 2015-01-05 MED ORDER — MYCOPHENOLATE MOFETIL 250 MG PO CAPS
2500.0000 mg | ORAL_CAPSULE | Freq: Two times a day (BID) | ORAL | Status: DC
  Filled 2015-01-05: qty 10

## 2015-01-05 MED ORDER — ATORVASTATIN CALCIUM 40 MG PO TABS: 40.0000 mg | ORAL_TABLET | Freq: Every evening | ORAL | Status: DC

## 2015-01-05 MED ORDER — GABAPENTIN 100 MG PO CAPS: 100.0000 mg | ORAL_CAPSULE | Freq: Three times a day (TID) | ORAL | Status: DC

## 2015-01-05 MED ORDER — PROCHLORPERAZINE EDISYLATE 5 MG/ML IJ SOLN
5.0000 mg | Freq: Four times a day (QID) | INTRAMUSCULAR | Status: DC | PRN
  Administered 2015-01-07: 5 mg via INTRAVENOUS
  Filled 2015-01-05 (×3): qty 1

## 2015-01-05 MED ORDER — CLONIDINE HCL 0.2 MG PO TABS
0.2000 mg | ORAL_TABLET | Freq: Three times a day (TID) | ORAL | Status: DC
  Administered 2015-01-05: 0.2 mg via ORAL
  Filled 2015-01-05 (×4): qty 1

## 2015-01-05 MED ORDER — GABAPENTIN 100 MG PO CAPS
100.0000 mg | ORAL_CAPSULE | Freq: Three times a day (TID) | ORAL | Status: DC
  Administered 2015-01-05 – 2015-01-08 (×8): 100 mg via ORAL
  Filled 2015-01-05 (×10): qty 1

## 2015-01-05 NOTE — Discharge Summary (Addendum)
D/C SUMMARY  Adriana Tucker  MR#: [id number]  DOB:[birth year]  Date of Admission: 12/29/2014 Date of Discharge: 01/08/2015  Attending Physician:Virginio Isidore T  [place]ults:  Nephrology   Disposition: D/C home to [place] w/ close f/u arranged prior to d/c   Follow-up Appts:     Follow-up Information    Follow up with Dr. Susa Loffler  On 01/13/2015.   Why:  It is VERY IMPORTANT that you keep your scheduled appointment w/ Dr. Gaston Islam     Tests Needing Follow-up: -regular monitoring of renal function will be required -regular monitoring of BP and further titration of medications will be required  -CBG checks should be carried out w/ consideration to switching to insulin tx -recheck of lipid panel is suggested in ~12 weeks  Discharge Diagnoses: Hypertensive urgency with prior h/o cerebral aneurysm repair / hx HTN induced CVA Chronic kidney disease - Lupus nephritis  SLE Abnormal UA DM type 2 uncontrolled HLD Migraine headache Obesity - Body mass index is 38.74 kg/(m^2)  Initial presentation: 34 y.o. F Hx HTN, SLE on cellcept and pred, CKD, and cerebellar aneurysm x2 s/p clipping after rupture > cerebellar strokes (By hx the pt was initially locked-in, however with extensive physical therapy she regained near total R sided strength w/ chronic left sided weakness at baseline). She presented to the ED 12/29/14 with gradual onset severe diffuse headache. Symptoms onset at 9 AM 12/29/14 w/ progressive worsening. She is visiting the area from [place]. Her L sided weakness from prior aneurysm related stroke was unchanged. Her BP was found to be 183/103 in the ED despite reported compliance with her medications.  Hospital Course:  Hypertensive urgency with h/o cerebral aneurysm repair -CT/MRI negative for acute CVA -BP fluctuated but improved overall, with periods in which it was [id number] systolic - pt denied medication  noncompliance -review of available records in Care Everywhere confirm difficult to control chronic HTN  -attempts were made to avoid overcorrection, in setting of long standing severe HTN  -TSHnormal -pt counseled on absolute need for ongoing outpt close f/u for further titration of medications  -?if pt has had a reno-vascular evaluation  -stopped ACE/ARB due to progressive kidney disease  Chronic kidney disease (baseline ~2.0 Sept 2015) - Lupus nephritis  -Secondary to lupus and malignant HTN  -Nephrology consulted and was able to confirm pt has had renal bx in home state -Held all nephrotoxic medications - continued reported home med regimen of cellcept and prednisone  -pt advised of need for close ongoing monitoring of worsening renal fxn - pt to return home to [place] to continue ongoing care  -Nephrology did not feel her acute flair was related to her SLE, therefore her immunosuppressive medications were not adjusted - Nephrology postulated that her worsening renal fxn may well have been related to episodic relative hypotension (diastolic at times as low as 64 - at admit diastolic was [id number])  SLE w/ acute exacerbation (swelling R hand / pain) -Continued usual medical tx -ongoing care discussed w/ her Rheumatologist in [place] during her hospital stay, who was quite helpful in providing hx and assisting w/ coordinating ongoing care - pt gave me her permission to fax her medical information to Dr. Gaston Islam in [place] at the time of her d/c  -no clinical evidence to suggests acute SLE flair during this hospital stay   Abnormal UA -urine culture unrevealing - pt does not have sx suggestive of UTI - no abx were utilized  DM type 2 - uncontrolled during hospitalization  -A1c 6.2  -Secondary to patient's poor renal function a better long term choice would be insulin -very low dose lantus was all that was needed to control CBG during the stay  -will resume very low dose glucotrol temporarily at time of  d/c, but CBGs will need to be monitored in the outpt setting   HLD -LDL 207 - increased Lipitor 40 mg daily - PCP to titrate up further for full effect  Migraine headache / status migrainous -HA resolved at time of d/c  -pt was treated w/ a short course prochlorperazine protocal during this admit   Obesity - Body mass index is 38.74 kg/(m^2)     Medication List    STOP taking these medications        enalapril 5 MG tablet  Commonly known as:  VASOTEC     gabapentin 600 MG tablet  Commonly known as:  NEURONTIN  Replaced by:  gabapentin 100 MG capsule      TAKE these medications        atorvastatin 40 MG tablet  Commonly known as:  LIPITOR  Take 1 tablet (40 mg total) by mouth every evening.     buPROPion 300 MG 24 hr tablet  Commonly known as:  WELLBUTRIN XL  Take 300 mg by mouth daily.     cloNIDine 0.2 MG tablet  Commonly known as:  CATAPRES  Take 2 tablets (0.4 mg total) by mouth 3 (three) times daily.     furosemide 80 MG tablet  Commonly known as:  LASIX  Take 1 tablet (80 mg total) by mouth daily.     gabapentin 100 MG capsule  Commonly known as:  NEURONTIN  Take 1 capsule (100 mg total) by mouth 3 (three) times daily.     glipiZIDE 5 MG tablet  Commonly known as:  GLUCOTROL  Take 0.5 tablets (2.5 mg total) by mouth daily before breakfast.     hydrALAZINE 100 MG tablet  Commonly known as:  APRESOLINE  Take 1 tablet (100 mg total) by mouth every 8 (eight) hours.     metoprolol succinate 100 MG 24 hr tablet  Commonly known as:  TOPROL-XL  Take 1 tablet (100 mg total) by mouth 2 (two) times daily. Take with or immediately following a meal.     multivitamin with minerals tablet  Take 1 tablet by mouth daily.     mycophenolate 250 MG capsule  Commonly known as:  CELLCEPT  Take 1,000 mg by mouth every 12 (twelve) hours.     oxyCODONE-acetaminophen 5-325 MG per tablet  Commonly known as:  PERCOCET/ROXICET  Take 1 tablet by mouth 2 (two) times daily as  needed for severe pain (Maximum dose is 2 tablets per day).     predniSONE 10 MG tablet  Commonly known as:  DELTASONE  Take 10 mg by mouth 2 (two) times daily with a meal.     tolterodine 2 MG tablet  Commonly known as:  DETROL  Take 4 mg by mouth 2 (two) times daily.       Day of Discharge BP 129/78 mmHg  Pulse 71  Temp(Src) 97.7 F (36.5 C) (Oral)  Resp 16  Ht 5\' 2"  (1.575 m)  Wt 86.5 kg (190 lb 11.2 oz)  BMI 34.87 kg/m2  SpO2 99%  LMP 10/30/2014  Physical Exam: General: No acute respiratory distress - denies HA - is alert and oriented  Lungs: Clear to auscultation bilaterally without wheezes  or crackles Cardiovascular: Regular rate and rhythm without murmur gallop or rub normal S1 and S2 Abdomen: Nontender, nondistended, soft, bowel sounds positive, no rebound, no ascites, no appreciable mass Extremities: No significant cyanosis, clubbing, or edema bilateral lower extremities  Basic Metabolic Panel:  Recent Labs Lab 01/04/15 1043 01/05/15 0525 01/06/15 0402 01/07/15 0257 01/08/15 0351  NA 135 136 135 135 135  K 5.5* 5.5* 5.3* 4.7 5.0  CL 106 107 102 102 100  CO2 19 21 20 20 22   GLUCOSE 159* 176* 196* 250* 222*  BUN 75* 81* 89* 92* 92*  CREATININE 3.48* 3.69* 3.66* 4.02* 3.70*  CALCIUM 9.1 9.1 9.1 8.7 9.2  PHOS  --   --  5.6*  --  5.6*    Liver Function Tests:  Recent Labs Lab 01/02/15 1220 01/04/15 1043 01/05/15 0525 01/06/15 0402 01/08/15 0351  AST 16 20 21   --   --   ALT 17 15 14   --   --   ALKPHOS 49 57 52  --   --   BILITOT 0.5 0.5 0.6  --   --   PROT 7.1 7.0 6.9  --   --   ALBUMIN 3.1* 2.9* 3.0* 3.0* 3.0*   CBC:  Recent Labs Lab 01/02/15 1220 01/04/15 1043 01/08/15 0340  WBC 5.2 5.0 5.3  NEUTROABS  --  4.1  --   HGB 10.0* 9.0* 9.7*  HCT 32.6* 29.0* 31.3*  MCV 84.5 83.6 83.7  PLT 182 205 214   Cardiac Enzymes:  Recent Labs Lab 01/02/15 1220 01/02/15 1844 01/03/15 0107  TROPONINI <0.03 <0.03 <0.03   CBG:  Recent  Labs Lab 01/07/15 0813 01/07/15 1041 01/07/15 1716 01/07/15 2200 01/08/15 0827  GLUCAP 251* 190* 215* 214* 151*    Recent Results (from the past 240 hour(s))  MRSA PCR Screening     Status: None   Collection Time: 12/29/14 11:36 PM  Result Value Ref Range Status   MRSA by PCR NEGATIVE NEGATIVE Final    Comment:        The GeneXpert MRSA Assay (FDA approved for NASAL specimens only), is one component of a comprehensive MRSA colonization surveillance program. It is not intended to diagnose MRSA infection nor to guide or monitor treatment for MRSA infections.   Culture, Urine     Status: None   Collection Time: 12/31/14  8:08 PM  Result Value Ref Range Status   Specimen Description URINE, CLEAN CATCH  Final   Special Requests NONE  Final   Colony Count NO GROWTH Performed at [facility]   Final   Culture NO GROWTH Performed at [facility]   Final   Report Status 01/01/2015 FINAL  Final     Time spent in discharge (includes decision making & examination of pt): >35 minutes  01/08/2015, 10:36 AM   Cherene Altes, MD [facility] Office  [phone] Pager [phone]  On-Call/Text Page:      [email or web address]      password [facility]

## 2015-01-06 ENCOUNTER — Inpatient Hospital Stay (HOSPITAL_COMMUNITY): Payer: BLUE CROSS/BLUE SHIELD

## 2015-01-06 DIAGNOSIS — R51 Headache: Secondary | ICD-10-CM

## 2015-01-06 DIAGNOSIS — R519 Headache, unspecified: Secondary | ICD-10-CM | POA: Insufficient documentation

## 2015-01-06 LAB — RENAL FUNCTION PANEL
Albumin: 3 g/dL — ABNORMAL LOW (ref 3.5–5.2)
Anion gap: 13 (ref 5–15)
BUN: 89 mg/dL — AB (ref 6–23)
CALCIUM: 9.1 mg/dL (ref 8.4–10.5)
CO2: 20 mmol/L (ref 19–32)
Chloride: 102 mmol/L (ref 96–112)
Creatinine, Ser: 3.66 mg/dL — ABNORMAL HIGH (ref 0.50–1.10)
GFR calc non Af Amer: 15 mL/min — ABNORMAL LOW (ref >90)
GFR, EST AFRICAN AMERICAN: 18 mL/min — AB (ref >90)
GLUCOSE: 196 mg/dL — AB (ref 70–99)
Phosphorus: 5.6 mg/dL — ABNORMAL HIGH (ref 2.3–4.6)
Potassium: 5.3 mmol/L — ABNORMAL HIGH (ref 3.5–5.1)
SODIUM: 135 mmol/L (ref 135–145)

## 2015-01-06 LAB — RAPID HIV SCREEN (HIV 1/2 AB+AG)
HIV 1/2 Antibodies: NONREACTIVE
HIV-1 P24 Antigen - HIV24: NONREACTIVE

## 2015-01-06 MED ORDER — FUROSEMIDE 40 MG PO TABS
40.0000 mg | ORAL_TABLET | Freq: Once | ORAL | Status: AC
  Administered 2015-01-06: 40 mg via ORAL
  Filled 2015-01-06: qty 1

## 2015-01-06 MED ORDER — CLONIDINE HCL 0.2 MG PO TABS
0.4000 mg | ORAL_TABLET | Freq: Three times a day (TID) | ORAL | Status: DC
  Administered 2015-01-06 – 2015-01-08 (×5): 0.4 mg via ORAL
  Filled 2015-01-06 (×7): qty 2

## 2015-01-06 MED ORDER — CLONIDINE HCL 0.3 MG PO TABS
0.3000 mg | ORAL_TABLET | Freq: Three times a day (TID) | ORAL | Status: DC
  Filled 2015-01-06 (×2): qty 1

## 2015-01-06 MED ORDER — FUROSEMIDE 80 MG PO TABS
80.0000 mg | ORAL_TABLET | Freq: Every day | ORAL | Status: DC
  Administered 2015-01-07 – 2015-01-08 (×2): 80 mg via ORAL
  Filled 2015-01-06 (×2): qty 1

## 2015-01-06 MED ORDER — GI COCKTAIL ~~LOC~~
30.0000 mL | Freq: Two times a day (BID) | ORAL | Status: DC | PRN
  Administered 2015-01-06 – 2015-01-07 (×2): 30 mL via ORAL
  Filled 2015-01-06 (×3): qty 30

## 2015-01-06 MED ORDER — FUROSEMIDE 10 MG/ML IJ SOLN: 80.0000 mg | Freq: Every day | INTRAMUSCULAR | Status: DC

## 2015-01-06 MED ORDER — PANTOPRAZOLE SODIUM 40 MG PO TBEC
40.0000 mg | DELAYED_RELEASE_TABLET | Freq: Every day | ORAL | Status: DC
  Administered 2015-01-06 – 2015-01-08 (×3): 40 mg via ORAL
  Filled 2015-01-06 (×3): qty 1

## 2015-01-06 NOTE — Consult Note (Signed)
I was asked by Dr. Sherral Hammers to see Adriana Tucker who is a 34 y.o. female admitted with a headache and severe hypertension with BPs of 184/142 &189/126 on admission.  She has known SLE diagnosed in [place] and she had a kidney biopsy at that time and was treated with cyclophosphamide.  She had cerebral aneurysms in the past which bled and required clipping and caused a CVA.  She has been maintained on mycophenolate and prednisone with recurrent V. Zoster associated with increased doses of steroids.  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  There was no evidence of proliferative lupus GN. Serum cr on Jun 03, 2014 was 1.96 mg/dl.  C3 was nl at 98 and C4 low at 7 (18-55).  She does have occ arthralgias improved with steroids. Creat on admission on 4/4 was 2.68m/dl.  It has risen to 3.66 during the hospitalization.  Past Medical History  Diagnosis Date  . Hypertension   . Brain aneurysm   . Lupus   . CKD (chronic kidney disease)     due to lupus   Past Surgical History  Procedure Laterality Date  . Brain surgery    . Back surgery     Social History:  reports that she has quit smoking. She does not have any smokeless tobacco history on file. She reports that she does not drink alcohol or use illicit drugs. Allergies: No Known Allergies History reviewed. No pertinent family history.  Medications:  Scheduled: . atorvastatin  40 mg Oral QPM  . buPROPion  300 mg Oral Daily  . cloNIDine  0.3 mg Oral TID  . furosemide  40 mg Intravenous Q12H  . gabapentin  100 mg Oral TID  . hydrALAZINE  100 mg Oral 3 times per day  . insulin glargine  5 Units Subcutaneous QHS  . isosorbide dinitrate  10 mg Oral TID  . metoprolol succinate  100 mg Oral BID  . multivitamin with minerals  1 tablet Oral Daily  .  mycophenolate  1,000 mg Oral Q12H  . pantoprazole  40 mg Oral Daily  . predniSONE  10 mg Oral BID WC  . tolterodine  4 mg Oral BID     ROS: occ arthralgias, headaches with severe hypertension   Blood pressure 144/90, pulse 77, temperature 98.6 F (37 C), temperature source Oral, resp. rate 16, height '5\' 2"'  (1.575 m), weight 97.2 kg (214 lb 4.6 oz), last menstrual period 10/30/2014, SpO2 100 %.  General appearance: alert and cooperative Head: Normocephalic, without obvious abnormality, atraumatic Throat: lips, mucosa, and tongue normal; teeth and gums normal Resp: clear to auscultation bilaterally Chest wall: no tenderness Cardio: regular rate and rhythm, S1, S2 normal, no murmur, click, rub or gallop GI: soft, non-tender; bowel sounds normal; no masses,  no organomegaly Extremities: edema 1+ Skin: Skin color, texture, turgor normal. No rashes or lesions Neurologic: Grossly normal Results for orders placed or performed during the hospital encounter of 12/29/14 (from the past 48 hour(s))  Comprehensive metabolic panel     Status: Abnormal   Collection Time: 01/05/15  5:25 AM  Result Value Ref Range   Sodium 136 135 - 145 mmol/L   Potassium 5.5 (H) 3.5 - 5.1 mmol/L   Chloride 107 96 - 112 mmol/L   CO2 21 19 - 32 mmol/L   Glucose, Bld 176 (H) 70 - 99  mg/dL   BUN 81 (H) 6 - 23 mg/dL   Creatinine, Ser 3.69 (H) 0.50 - 1.10 mg/dL   Calcium 9.1 8.4 - 10.5 mg/dL   Total Protein 6.9 6.0 - 8.3 g/dL   Albumin 3.0 (L) 3.5 - 5.2 g/dL   AST 21 0 - 37 U/L   ALT 14 0 - 35 U/L   Alkaline Phosphatase 52 39 - 117 U/L   Total Bilirubin 0.6 0.3 - 1.2 mg/dL   GFR calc non Af Amer 15 (L) >90 mL/min   GFR calc Af Amer 17 (L) >90 mL/min    Comment: (NOTE) The eGFR has been calculated using the CKD EPI equation. This calculation has not been validated in all clinical situations. eGFR's persistently <90 mL/min signify possible Chronic Kidney Disease.    Anion gap 8 5 - 15  Renal function panel      Status: Abnormal   Collection Time: 01/06/15  4:02 AM  Result Value Ref Range   Sodium 135 135 - 145 mmol/L   Potassium 5.3 (H) 3.5 - 5.1 mmol/L   Chloride 102 96 - 112 mmol/L   CO2 20 19 - 32 mmol/L   Glucose, Bld 196 (H) 70 - 99 mg/dL   BUN 89 (H) 6 - 23 mg/dL   Creatinine, Ser 3.66 (H) 0.50 - 1.10 mg/dL   Calcium 9.1 8.4 - 10.5 mg/dL   Phosphorus 5.6 (H) 2.3 - 4.6 mg/dL   Albumin 3.0 (L) 3.5 - 5.2 g/dL   GFR calc non Af Amer 15 (L) >90 mL/min   GFR calc Af Amer 18 (L) >90 mL/min    Comment: (NOTE) The eGFR has been calculated using the CKD EPI equation. This calculation has not been validated in all clinical situations. eGFR's persistently <90 mL/min signify possible Chronic Kidney Disease.    Anion gap 13 5 - 15  Rapid HIV screen (HIV 1/2 Ab+Ag)     Status: None   Collection Time: 01/06/15 11:38 AM  Result Value Ref Range   HIV-1 P24 Antigen - HIV24 NON REACTIVE NON REACTIVE   HIV 1/2 Antibodies NON REACTIVE NON REACTIVE   Interpretation (HIV Ag Ab)      A non reactive test result means that HIV 1 or HIV 2 antibodies and HIV 1 p24 antigen were not detected in the specimen.   US Renal  01/06/2015   CLINICAL DATA:  34 year old female with acute on chronic renal failure. Subsequent encounter.  EXAM: RENAL/URINARY TRACT ULTRASOUND COMPLETE  COMPARISON:  None.  FINDINGS: Right Kidney:  Length: 9.6 cm. Diffusely echogenic cortex with poor corticomedullary differentiation. No hydronephrosis or right renal lesion.  Left Kidney:  Length: Approximately 9.6 cm. Not as well visualized as the right side due to diffusely echogenic renal cortex. Poor corticomedullary differentiation. No hydronephrosis or left renal lesion is evident.  Bladder:  Not visualized, reportedly a urinary catheter is in place.  IMPRESSION: 1. Echogenic kidneys could reflect chronic medical renal disease or HIV nephropathy. 2. No hydronephrosis or acute findings are evident.   Electronically Signed   By: Genevie Ann M.D.    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  I see no indication for more aggressive therapy. 6 Will stop nitrates  Nikia Levels C 01/06/2015, 3:56 PM

## 2015-01-06 NOTE — Progress Notes (Signed)
[place] TEAM 1 - Stepdown/ICU TEAM Progress Note  Adriana Tucker LPF:[id number] DOB: 1981/05/06 DOA: 12/29/2014 PCP: Pcp Not In System  Admit HPI / Brief Narrative: Adriana Tucker is a 34 y.o. 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 Wound up with cerebellar strokes initially patient was locked-in; however, very luckily with a large amount of physical therapy has regained near total R sided strength, still has chronic left sided weakness at baseline now.  Patient presents to the ED with gradual onset, severe diffuse headache. Symptoms onset at 9 AM this morning and progressively worsening. She is visiting the area from [place]. Her L sided weakness from prior aneurysm related stroke is unchanged. Her BP is elevated on arrival to ED despite compliance with medications. 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   HPI/Subjective: 4/12 A/O 4, states continued headache resolution. Lupus pain in right hand and bilateral knees resolved     Assessment/Plan: Hypertensive urgency with h/o cerebral aneurysm repair  -CT/MRI negative for acute CVA -Increase Clonidine 0.3 mg TID -Continue Furosemide 40 mg daily -Continue metoprolol XL 100 mg BID -Continue Hold enalapril secondary to chronic kidney disease  -Continue Hydralazine 100 mg TID -Continue Isosorbide dinitrate 10 mgTID  Acute on Chronic kidney disease (baseline 2.1 according to records from [place] in December 2015) -Secondary to lupus -See hypertensive urgency -Hold all nephrotoxic medication -Unfortunately in the last 48 hours patient's kidney function has taken a drastic turn for the worse. Cr=Patient's kidney function has slightly declined with a Cr= 3.66 -On 4/11 Dr. Thereasa Solo spoke Dr Susa Loffler (rheumatologist) in [place] and she recommended decreasing prednisone back to her base dose of 10 mg BID,  increasing CellCept to 2565m BID, starting rituximab and speaking with our nephrologist. -Spoke with Dr. AErling Cruz(nephrology), who recommended holding on increasing medication until he sees patient today. -HIV screening pending  Lupus nephritis -See acute on chronic kidney disease -ESR/CRP elevated -4/8 ;  0800 left message with Dr LSusa Loffler(rheumatologist), [phone] Dr LSusa Loffler(rheumatologist), did not return call. -See acute on chronic kidney disease  Lupus exacerbation  -Per Dr LSusa Loffler(rheumatologist) request prednisone decreased to her home dose of 10 mg BID -Swelling/pain in right hand and bilateral knees resolved  Urinary retention -Spoke w/ Pharmacist Tigan who has located patient's home Detrol -Detrol 4 mg BID -Foley placed -Strict in and out since admission;-1249m-Daily weight  DM type 2 controlled - Hemoglobin A1c = 6.2 -Secondary to patient's poor renal function a better choice would be insulin; no need to adjust for renal function. - CBG better controlled with decrease in steroids.   HLD -Lipid panel not within ADA/AHA guidelines -Continue Lipitor 40 mg daily.   Migraine headache/status migrainous -ESR, CRP elevated, however not worrisome for temporal arteritis  -Refractory headache. Imitrex subcutaneous provides some relief however pain returns within a couple hours, Also no resolution with better BP control.  -Patient unable to have Toradol protocol secondary to chronic kidney failure, or DHEA protocol secondary to uncontrolled/difficult to control BP -IV Prochlorperazine protocol; eliminated patient's headache. Patient remains headache free.  -4/8 head CT; negative for acute findings. -Continue PRN Prochlorperazine     Code Status: FULL Family Communication: no family present at time of exam Disposition Plan: Resolution hypertensive urgency/status migrainous; DC next 24-48  hours  Consultants: Dr. Erling Cruz (nephrology)     Procedure/Significant Events: 4/4 CT head without contrast;. Bilateral cerebellar infarcts, Lt> Rt. Several of these lesions appear remote. -Age indeterminate 7 mm right splenic infarct. Also within the posterior circulation territory. - No evidence for acute/ remote ischemia within the anterior circulation.  4/5 MRI/MRI head/brain:-No acute intracranial infarct or other abnormality identified. -Multiple remote bilateral cerebellar infarcts, Lt> Rt  with additional small remote lacunar infarct within the right thalamus. -Empty sella. MRA HEAD; Unremarkable  4/8 CT head without contrast;- Stable appearance of multiple old bilateral cerebellar infarcts/small remote lacunar infarcts of the right thalamus and right lentiform nucleus.  -No acute intracranial findings. -Partially empty sella. 4/12 renal ultrasound;. -chronic medical renal disease or HIV nephropathy. -No hydronephrosis or acute findings are evident.  Culture NA  Antibiotics: NA  DVT prophylaxis: SCD   Devices NA   LINES / TUBES:  NA    Continuous Infusions:   Objective: VITAL SIGNS: Temp: 97.6 F (36.4 C) (04/12 0400) Temp Source: Oral (04/12 0400) BP: 164/107 mmHg (04/12 0616) Pulse Rate: 79 (04/11 2000) SPO2; FIO2:   Intake/Output Summary (Last 24 hours) at 01/06/15 0756 Last data filed at 01/06/15 0400  Gross per 24 hour  Intake      0 ml  Output   1400 ml  Net  -1400 ml     Exam: General: A/O 4, NAD, No acute respiratory distress Lungs: Clear to auscultation bilaterally without wheezes or crackles Cardiovascular: Regular rate and rhythm without murmur gallop or rub normal S1 and S2 Abdomen: Nontender, nondistended, soft, bowel sounds positive, no rebound, no ascites, no appreciable mass Extremities: No significant cyanosis, clubbing, or edema bilateral lower extremities.  Neurologic: Cranial nerves II through XII intact, pupils  equal round reactive to light and accommodation, tongue/uvula midline, left sided hemiparesis (baseline per patient), RU E/RLE strength 5/5, sensation intact, negative pain to palpation over right temporal area, negative jaw claudication.  Data Reviewed: Basic Metabolic Panel:  Recent Labs Lab 01/01/15 0348 01/02/15 1220 01/04/15 1043 01/05/15 0525 01/06/15 0402  NA 140 138 135 136 135  K 4.6 4.5 5.5* 5.5* 5.3*  CL 112 107 106 107 102  CO2 '20 21 19 21 20  ' GLUCOSE 214* 105* 159* 176* 196*  BUN 48* 50* 75* 81* 89*  CREATININE 3.05* 2.67* 3.48* 3.69* 3.66*  CALCIUM 8.5 9.4 9.1 9.1 9.1  PHOS 3.8  --   --   --  5.6*   Liver Function Tests:  Recent Labs Lab 12/30/14 0958 01/01/15 0348 01/02/15 1220 01/04/15 1043 01/05/15 0525 01/06/15 0402  AST 19  --  '16 20 21  ' --   ALT 17  --  '17 15 14  ' --   ALKPHOS 42  --  49 57 52  --   BILITOT 0.5  --  0.5 0.5 0.6  --   PROT 5.8*  --  7.1 7.0 6.9  --   ALBUMIN 2.6* 2.7* 3.1* 2.9* 3.0* 3.0*   No results for input(s): LIPASE, AMYLASE in the last 168 hours. No results for input(s): AMMONIA in the last 168 hours. CBC:  Recent Labs Lab 12/30/14 0958 01/02/15 1220 01/04/15 1043  WBC 3.3* 5.2 5.0  NEUTROABS 2.3  --  4.1  HGB 8.6* 10.0* 9.0*  HCT 27.7* 32.6* 29.0*  MCV 83.4 84.5 83.6  PLT 139* 182 205   Cardiac Enzymes:  Recent Labs Lab 01/02/15 1220 01/02/15 1844 01/03/15 0107  TROPONINI <0.03 <0.03 <0.03   BNP (last  3 results) No results for input(s): BNP in the last 8760 hours.  ProBNP (last 3 results) No results for input(s): PROBNP in the last 8760 hours.  CBG:  Recent Labs Lab 12/30/14 2104 12/31/14 2205 01/02/15 2102 01/03/15 2118  GLUCAP 137* 169* 162* 162*    Recent Results (from the past 240 hour(s))  MRSA PCR Screening     Status: None   Collection Time: 12/29/14 11:36 PM  Result Value Ref Range Status   MRSA by PCR NEGATIVE NEGATIVE Final    Comment:        The GeneXpert MRSA Assay  (FDA approved for NASAL specimens only), is one component of a comprehensive MRSA colonization surveillance program. It is not intended to diagnose MRSA infection nor to guide or monitor treatment for MRSA infections.   Culture, Urine     Status: None   Collection Time: 12/31/14  8:08 PM  Result Value Ref Range Status   Specimen Description URINE, CLEAN CATCH  Final   Special Requests NONE  Final   Colony Count NO GROWTH Performed at [facility]   Final   Culture NO GROWTH Performed at [facility]   Final   Report Status 01/01/2015 FINAL  Final     Studies:  Recent x-ray studies have been reviewed in detail by the Attending Physician  Scheduled Meds:  Scheduled Meds: . atorvastatin  40 mg Oral QPM  . buPROPion  300 mg Oral Daily  . cloNIDine  0.2 mg Oral TID  . furosemide  40 mg Intravenous Q12H  . gabapentin  100 mg Oral TID  . hydrALAZINE  100 mg Oral 3 times per day  . insulin glargine  5 Units Subcutaneous QHS  . isosorbide dinitrate  10 mg Oral TID  . metoprolol succinate  100 mg Oral BID  . multivitamin with minerals  1 tablet Oral Daily  . mycophenolate  1,000 mg Oral Q12H  . predniSONE  10 mg Oral BID WC  . tolterodine  4 mg Oral BID    Time spent on care of this patient: 40 mins   WOODS, Geraldo Docker , MD  [facility] Office  [phone] Pager - [phone]  On-Call/Text Page:      [email or web address]      password [email or web address]  If 7PM-7AM, please contact night-coverage [email or web address] Password [facility] 01/06/2015, 7:56 AM   LOS: 8 days   Care during the described time interval was provided by me .  I have reviewed this patient's available data, including medical history, events of note, physical examination, radiology studies and test results as part of my evaluation  Dia Crawford, MD [phone] Pager

## 2015-01-06 NOTE — Plan of Care (Signed)
Problem: Phase I Progression Outcomes Goal: OOB as tolerated unless otherwise ordered Outcome: Completed/Met Date Met:  01/06/15 Pt is able to get out of bed and walk with one assist same as her baseline status

## 2015-01-07 LAB — BASIC METABOLIC PANEL
Anion gap: 13 (ref 5–15)
BUN: 92 mg/dL — AB (ref 6–23)
CO2: 20 mmol/L (ref 19–32)
Calcium: 8.7 mg/dL (ref 8.4–10.5)
Chloride: 102 mmol/L (ref 96–112)
Creatinine, Ser: 4.02 mg/dL — ABNORMAL HIGH (ref 0.50–1.10)
GFR calc Af Amer: 16 mL/min — ABNORMAL LOW (ref >90)
GFR calc non Af Amer: 14 mL/min — ABNORMAL LOW (ref >90)
Glucose, Bld: 250 mg/dL — ABNORMAL HIGH (ref 70–99)
POTASSIUM: 4.7 mmol/L (ref 3.5–5.1)
Sodium: 135 mmol/L (ref 135–145)

## 2015-01-07 LAB — GLUCOSE, CAPILLARY
GLUCOSE-CAPILLARY: 215 mg/dL — AB (ref 70–99)
GLUCOSE-CAPILLARY: 214 mg/dL — AB (ref 70–99)
Glucose-Capillary: 251 mg/dL — ABNORMAL HIGH (ref 70–99)
Glucose-Capillary: 190 mg/dL — ABNORMAL HIGH (ref 70–99)

## 2015-01-07 MED ORDER — ONDANSETRON HCL 4 MG/2ML IJ SOLN: 4.0000 mg | Freq: Four times a day (QID) | INTRAMUSCULAR | Status: DC | PRN

## 2015-01-07 MED ORDER — GLIPIZIDE 2.5 MG HALF TABLET
2.5000 mg | ORAL_TABLET | Freq: Every day | ORAL | Status: DC
  Administered 2015-01-08: 2.5 mg via ORAL
  Filled 2015-01-07 (×2): qty 1

## 2015-01-07 NOTE — Progress Notes (Signed)
[facility] TEAM 1 - Stepdown/ICU TEAM Progress Note  Adriana Tucker [id number] DOB: [birth year] DOA: 12/29/2014 PCP: Pcp Not In System  Admit HPI / Brief Narrative: 34 y.o. F Hx HTN, SLE on cellcpet and pred, CKD, and cerebellar aneurysm x2 s/p clipping after rupture > cerebellar strokes.  Initially patient was locked-in, however with a large amount of physical therapy has regained near total R sided strength w/ chronic left sided weakness at baseline.  She presented to the ED with gradual onset severe diffuse headache. Symptoms onset at 9 AM 12/29/14 w/ progressive worsening. She is visiting the area from [place]. Her L sided weakness from prior aneurysm related stroke was unchanged. Her BP was elevated on arrival to the ED despite reported compliance with her medications.  HPI/Subjective: Pt has no new complaints today.  She denies current HA, n/v, sob, CP.    Assessment/Plan:  Hypertensive urgency with h/o cerebral aneurysm repair and CVA -CT/MRI negative for acute CVA -BP appears to be stabilizing  -review of available records in [place] confirm difficult to control chronic HTN  -TSHnormal -wish to avoid over-correction acutely - pt counseled on absolute need for ongoing outpt close f/u for further titration of medications  -?if pt has had a reno-vascular evaluation  -stopped ACE/ARB due to progressive kidney disease  Acute on Chronic kidney disease (baseline ~2.0 Sept 2015) -chronic disease due to SLE GN -acute exacerbation believed to be due to transient relative hypotension per Nephrology w/ no clinical evidence to suggest acute CHF flair  -Held all nephrotoxic medications - continued reported home med regimen of cellcept and prednisone  -pt advised of need for close ongoing monitoring of worsening renal fxn - pt to return home to [place] to continue ongoing care   SLE -Continue usual medical tx to include Cellcept and prednisone  -Dr. Thereasa Solo has been in close  contact w/ Dr Susa Loffler (Rheumatologist) that patient sees in [place] who will see the pt in f/u shortly upon her return to [place]  Abnormal UA - frequent self caths  -urine culture unrevealing - pt does not have sx suggestive of UTI - pt self caths due to intermittent retention related to her prior CVA  DM type 2 uncontrolled -A1c 6.2  -resume low dose glucotrol but if crt continues to climb may have to consider transition to outpt insulin  -very low dose lantus was all that was needed to control CBG during this stay   HLD -LDL 207 - increased Lipitor 20 mg daily - PCP to titrate up further for full effect  Migraine headache / status migrainous -HA resolved  -pt was treated w/ a short course prochlorperazine protocal during this admit   Obesity - Body mass index is 38.74 kg/(m^2  Code Status: FULL Family Communication: spoke w/ mother at bedside  Disposition Plan: SDU - plan for d/c in AM therefore will not transfer pt   Consultants: Nephrology   Procedure/Significant Events: 4/4 CT head without contrast Bilateral cerebellar infarcts, Lt> Rt. Several of these lesions appear remote. -Age indeterminate 7 mm right splenic infarct. Also within the posterior circulation territory. - No evidence for acute/ remote ischemia within the anterior circulation. 4/5 MRI/MRA head/brain No acute intracranial infarct or other abnormality identified. -Multiple remote bilateral cerebellar infarcts, Lt> Rt  with additional small remote lacunar infarct within the right thalamus -Empty sella.  Antibiotics: NA  DVT prophylaxis: SCD  Objective: Blood pressure 132/119, pulse 76, temperature 98.6 F (37 C), temperature source Oral, resp.  rate 18, height 5\' 2"  (1.575 m), weight 96.1 kg (211 lb 13.8 oz), last menstrual period 10/30/2014, SpO2 98 %.  Intake/Output Summary (Last 24 hours) at 01/07/15 1550 Last data filed at 01/07/15 0933  Gross per 24 hour  Intake      0 ml  Output    805 ml    Net   -805 ml   Exam: General: No acute respiratory distress Lungs: Clear to auscultation bilaterally  Cardiovascular: Regular rate and rhythm without murmur gallop or rub  Abdomen: Nontender, nondistended, soft, bowel sounds positive, no rebound, no ascites, no appreciable mass Extremities: No significant cyanosis, clubbing, trace edema bilateral lower extremities  Data Reviewed: Basic Metabolic Panel:  Recent Labs Lab 01/01/15 0348 01/02/15 1220 01/04/15 1043 01/05/15 0525 01/06/15 0402 01/07/15 0257  NA 140 138 135 136 135 135  K 4.6 4.5 5.5* 5.5* 5.3* 4.7  CL 112 107 106 107 102 102  CO2 20 21 19 21 20 20   GLUCOSE 214* 105* 159* 176* 196* 250*  BUN 48* 50* 75* 81* 89* 92*  CREATININE 3.05* 2.67* 3.48* 3.69* 3.66* 4.02*  CALCIUM 8.5 9.4 9.1 9.1 9.1 8.7  PHOS 3.8  --   --   --  5.6*  --    Liver Function Tests:  Recent Labs Lab 01/01/15 0348 01/02/15 1220 01/04/15 1043 01/05/15 0525 01/06/15 0402  AST  --  16 20 21   --   ALT  --  17 15 14   --   ALKPHOS  --  49 57 52  --   BILITOT  --  0.5 0.5 0.6  --   PROT  --  7.1 7.0 6.9  --   ALBUMIN 2.7* 3.1* 2.9* 3.0* 3.0*   CBC:  Recent Labs Lab 01/02/15 1220 01/04/15 1043  WBC 5.2 5.0  NEUTROABS  --  4.1  HGB 10.0* 9.0*  HCT 32.6* 29.0*  MCV 84.5 83.6  PLT 182 205   CBG:  Recent Labs Lab 12/31/14 2205 01/02/15 2102 01/03/15 2118 01/07/15 0813 01/07/15 1041  GLUCAP 169* 162* 162* 251* 190*    Recent Results (from the past 240 hour(s))  MRSA PCR Screening     Status: None   Collection Time: 12/29/14 11:36 PM  Result Value Ref Range Status   MRSA by PCR NEGATIVE NEGATIVE Final    Comment:        The GeneXpert MRSA Assay (FDA approved for NASAL specimens only), is one component of a comprehensive MRSA colonization surveillance program. It is not intended to diagnose MRSA infection nor to guide or monitor treatment for MRSA infections.   Culture, Urine     Status: None   Collection Time:  12/31/14  8:08 PM  Result Value Ref Range Status   Specimen Description URINE, CLEAN CATCH  Final   Special Requests NONE  Final   Colony Count NO GROWTH Performed at [facility]   Final   Culture NO GROWTH Performed at [facility]   Final   Report Status 01/01/2015 FINAL  Final     Studies:  Recent x-ray studies have been reviewed in detail by the Attending Physician  Scheduled Meds:  Scheduled Meds: . atorvastatin  40 mg Oral QPM  . buPROPion  300 mg Oral Daily  . cloNIDine  0.4 mg Oral TID  . furosemide  80 mg Oral Daily  . gabapentin  100 mg Oral TID  . hydrALAZINE  100 mg Oral 3 times per day  . insulin  glargine  5 Units Subcutaneous QHS  . metoprolol succinate  100 mg Oral BID  . multivitamin with minerals  1 tablet Oral Daily  . mycophenolate  1,000 mg Oral Q12H  . pantoprazole  40 mg Oral Daily  . predniSONE  10 mg Oral BID WC  . tolterodine  4 mg Oral BID    Time spent on care of this patient: 25 mins  Cherene Altes, MD [facility] For Consults/Admissions - Flow Manager - [phone] Office  [phone]  Contact MD directly via text page:      [email or web address]      password [email or web address]  01/07/2015, 3:50 PM   LOS: 9 days

## 2015-01-07 NOTE — Plan of Care (Signed)
Problem: Phase I Progression Outcomes Goal: Voiding-avoid urinary catheter unless indicated Outcome: Progressing pts foley was taken out 4/12 1900 and pt is back to baseline of self-catheterization

## 2015-01-07 NOTE — Progress Notes (Signed)
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  If renal fct stabilizing can prob be discharged a a day or two as I suspect renal fct will take a few weeks to correct   Objective: Vital signs in last 24 hours: Temp:  [98.6 F (37 C)-99.4 F (37.4 C)] 99.4 F (37.4 C) (04/13 0933) Pulse Rate:  [77-92] 84 (04/13 1016) Resp:  [13-22] 20 (04/13 0933) BP: (115-156)/(64-100) 119/65 mmHg (04/13 1016) SpO2:  [96 %-100 %] 98 % (04/13 0933) Weight:  [96.1 kg (211 lb 13.8 oz)-96.3 kg (212 lb 4.9 oz)] 96.1 kg (211 lb 13.8 oz) (04/13 0500) Weight change:   Intake/Output from previous day: 04/12 0701 - 04/13 0700 In: -  Out: 2104 [Urine:2101; Stool:3] Intake/Output this shift: Total I/O In: -  Out: 1 [Urine:1]  General appearance: alert and cooperative Exam unchanged from yesterday  Lab Results:  Recent Labs  01/04/15 1043  WBC 5.0  HGB 9.0*  HCT 29.0*  PLT 205   BMET:  Recent Labs  01/06/15 0402 01/07/15 0257  NA 135 135  K 5.3* 4.7  CL 102 102  CO2 20 20  GLUCOSE 196* 250*  BUN 89* 92*  CREATININE 3.66* 4.02*  CALCIUM 9.1 8.7   No results for input(s): PTH in the last 72 hours. Iron Studies: No results for input(s): IRON, TIBC, TRANSFERRIN, FERRITIN in the last 72 hours. Studies/Results: US Renal  01/06/2015   CLINICAL DATA:  34 year old female with acute on chronic renal failure. Subsequent encounter.  EXAM: RENAL/URINARY TRACT ULTRASOUND COMPLETE  COMPARISON:  None.  FINDINGS: Right Kidney:  Length: 9.6 cm. Diffusely echogenic cortex with poor corticomedullary differentiation. No hydronephrosis or right renal lesion.  Left Kidney:  Length: Approximately 9.6 cm. Not as well visualized as the right side due to diffusely echogenic renal cortex.  Poor corticomedullary differentiation. No hydronephrosis or left renal lesion is evident.  Bladder:  Not visualized, reportedly a urinary catheter is in place.  IMPRESSION: 1. Echogenic kidneys could reflect chronic medical renal disease or HIV nephropathy. 2. No hydronephrosis or acute findings are evident.   Electronically Signed   By: Genevie Ann M.D.   On: 01/06/2015 08:50   Scheduled: . atorvastatin  40 mg Oral QPM  . buPROPion  300 mg Oral Daily  . cloNIDine  0.4 mg Oral TID  . furosemide  80 mg Oral Daily  . gabapentin  100 mg Oral TID  . hydrALAZINE  100 mg Oral 3 times per day  . insulin glargine  5 Units Subcutaneous QHS  . metoprolol succinate  100 mg Oral BID  . multivitamin with minerals  1 tablet Oral Daily  . mycophenolate  1,000 mg Oral Q12H  . pantoprazole  40 mg Oral Daily  . predniSONE  10 mg Oral BID WC  . tolterodine  4 mg Oral BID     LOS: 9 days   Catelin Manthe C 01/07/2015,10:39 AM

## 2015-01-08 LAB — CBC
HEMATOCRIT: 31.3 % — AB (ref 36.0–46.0)
Hemoglobin: 9.7 g/dL — ABNORMAL LOW (ref 12.0–15.0)
MCH: 25.9 pg — ABNORMAL LOW (ref 26.0–34.0)
MCHC: 31 g/dL (ref 30.0–36.0)
MCV: 83.7 fL (ref 78.0–100.0)
PLATELETS: 214 10*3/uL (ref 150–400)
RBC: 3.74 MIL/uL — ABNORMAL LOW (ref 3.87–5.11)
RDW: 14.5 % (ref 11.5–15.5)
WBC: 5.3 10*3/uL (ref 4.0–10.5)

## 2015-01-08 LAB — RENAL FUNCTION PANEL
ALBUMIN: 3 g/dL — AB (ref 3.5–5.2)
Anion gap: 13 (ref 5–15)
BUN: 92 mg/dL — AB (ref 6–23)
CO2: 22 mmol/L (ref 19–32)
CREATININE: 3.7 mg/dL — AB (ref 0.50–1.10)
Calcium: 9.2 mg/dL (ref 8.4–10.5)
Chloride: 100 mmol/L (ref 96–112)
GFR calc Af Amer: 17 mL/min — ABNORMAL LOW (ref >90)
GFR calc non Af Amer: 15 mL/min — ABNORMAL LOW (ref >90)
GLUCOSE: 222 mg/dL — AB (ref 70–99)
POTASSIUM: 5 mmol/L (ref 3.5–5.1)
Phosphorus: 5.6 mg/dL — ABNORMAL HIGH (ref 2.3–4.6)
Sodium: 135 mmol/L (ref 135–145)

## 2015-01-08 LAB — C4 COMPLEMENT: Complement C4, Body Fluid: 13 mg/dL — ABNORMAL LOW (ref 14–44)

## 2015-01-08 LAB — GLUCOSE, CAPILLARY
Glucose-Capillary: 151 mg/dL — ABNORMAL HIGH (ref 70–99)
Glucose-Capillary: 151 mg/dL — ABNORMAL HIGH (ref 70–99)

## 2015-01-08 MED ORDER — HYDRALAZINE HCL 100 MG PO TABS: 100.0000 mg | ORAL_TABLET | Freq: Three times a day (TID) | ORAL | Status: AC

## 2015-01-08 MED ORDER — GLIPIZIDE 5 MG PO TABS: 2.5000 mg | ORAL_TABLET | Freq: Every day | ORAL | Status: AC

## 2015-01-08 MED ORDER — FUROSEMIDE 80 MG PO TABS: 80.0000 mg | ORAL_TABLET | Freq: Every day | ORAL | Status: AC

## 2015-01-08 MED ORDER — CLONIDINE HCL 0.2 MG PO TABS: 0.4000 mg | ORAL_TABLET | Freq: Three times a day (TID) | ORAL | Status: AC

## 2015-01-08 MED ORDER — GABAPENTIN 100 MG PO CAPS: 100.0000 mg | ORAL_CAPSULE | Freq: Three times a day (TID) | ORAL | Status: AC

## 2015-01-08 MED ORDER — ATORVASTATIN CALCIUM 40 MG PO TABS: 40.0000 mg | ORAL_TABLET | Freq: Every evening | ORAL | Status: AC

## 2015-01-08 NOTE — Progress Notes (Signed)
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 Her nephrologist has access to EPIC. I explained the probable need for more meds for BP for optimal control.  I also asked her to be patient with the side effects of clonidine.  I will sign off.     Subjective: Interval History: c/o sleepiness  Objective: Vital signs in last 24 hours: Temp:  [98 F (36.7 C)-99.4 F (37.4 C)] 99.2 F (37.3 C) (04/14 0448) Pulse Rate:  [75-92] 82 (04/14 0448) Resp:  [16-20] 19 (04/14 0448) BP: (119-162)/(65-119) 146/86 mmHg (04/14 0448) SpO2:  [96 %-98 %] 98 % (04/14 0448) Weight:  [86.5 kg (190 lb 11.2 oz)] 86.5 kg (190 lb 11.2 oz) (04/14 0500) Weight change: -9.8 kg (-21 lb 9.7 oz)  Intake/Output from previous day: 04/13 0701 - 04/14 0700 In: -  Out: 1 [Urine:1] Intake/Output this shift:    Exam unchanged  Lab Results:  Recent Labs  01/08/15 0340  WBC 5.3  HGB 9.7*  HCT 31.3*  PLT 214   BMET:  Recent Labs  01/07/15 0257 01/08/15 0351  NA 135 135  K 4.7 5.0  CL 102 100  CO2 20 22  GLUCOSE 250* 222*  BUN 92* 92*  CREATININE 4.02* 3.70*  CALCIUM 8.7 9.2   No results for input(s): PTH in the last 72 hours. Iron Studies: No results for input(s): IRON, TIBC, TRANSFERRIN, FERRITIN in the last 72 hours. Studies/Results: US Renal  01/06/2015   CLINICAL DATA:  34 year old female with acute on chronic renal failure. Subsequent encounter.  EXAM: RENAL/URINARY TRACT ULTRASOUND COMPLETE  COMPARISON:  None.  FINDINGS: Right Kidney:  Length: 9.6 cm. Diffusely echogenic cortex with poor corticomedullary differentiation. No hydronephrosis or right renal lesion.  Left Kidney:  Length: Approximately 9.6 cm.  Not as well visualized as the right side due to diffusely echogenic renal cortex. Poor corticomedullary differentiation. No hydronephrosis or left renal lesion is evident.  Bladder:  Not visualized, reportedly a urinary catheter is in place.  IMPRESSION: 1. Echogenic kidneys could reflect chronic medical renal disease or HIV nephropathy. 2. No hydronephrosis or acute findings are evident.   Electronically Signed   By: Genevie Ann M.D.   On: 01/06/2015 08:50    Scheduled: . atorvastatin  40 mg Oral QPM  . buPROPion  300 mg Oral Daily  . cloNIDine  0.4 mg Oral TID  . furosemide  80 mg Oral Daily  . gabapentin  100 mg Oral TID  . glipiZIDE  2.5 mg Oral QAC breakfast  . hydrALAZINE  100 mg Oral 3 times per day  . metoprolol succinate  100 mg Oral BID  . multivitamin with minerals  1 tablet Oral Daily  . mycophenolate  1,000 mg Oral Q12H  . pantoprazole  40 mg Oral Daily  . predniSONE  10 mg Oral BID WC  . tolterodine  4 mg Oral BID     LOS: 10 days   Merced Brougham C 01/08/2015,8:15 AM

## 2015-01-08 NOTE — Progress Notes (Signed)
Patient and patient's mother given discharge instructions. Patient educated on when to take medications. Patient given prescriptions. Patient and mother verbalize understanding.

## 2015-01-08 NOTE — Discharge Instructions (Signed)
To Whom it may concern,  Adriana Tucker has been under my care as a patient at [facility] from 12/29/14 through 01/08/2015.  Dr. Cherene Altes [facility] [phone]    Hypertension Hypertension is another name for high blood pressure. High blood pressure forces your heart to work harder to pump blood. A blood pressure reading has two numbers, which includes a higher number over a lower number (example: 110/72). HOME CARE   Have your blood pressure rechecked by your doctor.  Only take medicine as told by your doctor. Follow the directions carefully. The medicine does not work as well if you skip doses. Skipping doses also puts you at risk for problems.  Do not smoke.  Monitor your blood pressure at home as told by your doctor. GET HELP IF:  You think you are having a reaction to the medicine you are taking.  You have repeat headaches or feel dizzy.  You have puffiness (swelling) in your ankles.  You have trouble with your vision. GET HELP RIGHT AWAY IF:   You get a very bad headache and are confused.  You feel weak, numb, or faint.  You get chest or belly (abdominal) pain.  You throw up (vomit).  You cannot breathe very well. MAKE SURE YOU:   Understand these instructions.  Will watch your condition.  Will get help right away if you are not doing well or get worse. Document Released: 02/29/2008 Document Revised: 09/17/2013 Document Reviewed: 07/05/2013 [facility] Patient Information 2015 [place], [place]. This information is not intended to replace advice given to you by your health care provider. Make sure you discuss any questions you have with your health care provider.  Chronic Kidney Disease Chronic kidney disease occurs when the kidneys are damaged over a long period. The kidneys are two organs that lie on either side of the spine between the middle of the back and the front of the abdomen. The kidneys:   Remove wastes and extra  water from the blood.   Produce important hormones. These help keep bones strong, regulate blood pressure, and help create red blood cells.   Balance the fluids and chemicals in the blood and tissues. A small amount of kidney damage may not cause problems, but a large amount of damage may make it difficult or impossible for the kidneys to work the way they should. If steps are not taken to slow down the kidney damage or stop it from getting worse, the kidneys may stop working permanently. Most of the time, chronic kidney disease does not go away. However, it can often be controlled, and those with the disease can usually live normal lives. CAUSES  The most common causes of chronic kidney disease are diabetes and high blood pressure (hypertension). Chronic kidney disease may also be caused by:   Diseases that cause the kidneys' filters to become inflamed.   Diseases that affect the immune system.   Genetic diseases.   Medicines that damage the kidneys, such as anti-inflammatory medicines.  Poisoning or exposure to toxic substances.   A reoccurring kidney or urinary infection.   A problem with urine flow. This may be caused by:   Cancer.   Kidney stones.   An enlarged prostate in males. SIGNS AND SYMPTOMS  Because the kidney damage in chronic kidney disease occurs slowly, symptoms develop slowly and may not be obvious until the kidney damage becomes severe. A person may have a kidney disease for years without showing any symptoms. Symptoms  can include:   Swelling (edema) of the legs, ankles, or feet.   Tiredness (lethargy).   Nausea or vomiting.   Confusion.   Problems with urination, such as:   Decreased urine production.   Frequent urination, especially at night.   Frequent accidents in children who are potty trained.   Muscle twitches and cramps.   Shortness of breath.  Weakness.   Persistent itchiness.   Loss of appetite.  Metallic  taste in the mouth.  Trouble sleeping.  Slowed development in children.  Short stature in children. DIAGNOSIS  Chronic kidney disease may be detected and diagnosed by tests, including blood, urine, imaging, or kidney biopsy tests.  TREATMENT  Most chronic kidney diseases cannot be cured. Treatment usually involves relieving symptoms and preventing or slowing the progression of the disease. Treatment may include:   A special diet. You may need to avoid alcohol and foods thatare salty and high in potassium.   Medicines. These may:   Lower blood pressure.   Relieve anemia.   Relieve swelling.   Protect the bones. HOME CARE INSTRUCTIONS   Follow your prescribed diet.   Take medicines only as directed by your health care provider. Do not take any new medicines (prescription, over-the-counter, or nutritional supplements) unless approved by your health care provider. Many medicines can worsen your kidney damage or need to have the dose adjusted.   Quit smoking if you smoke. Talk to your health care provider about a smoking cessation program.   Keep all follow-up visits as directed by your health care provider. SEEK IMMEDIATE MEDICAL CARE IF:  Your symptoms get worse or you develop new symptoms.   You develop symptoms of end-stage kidney disease. These include:   Headaches.   Abnormally dark or light skin.   Numbness in the hands or feet.   Easy bruising.   Frequent hiccups.   Menstruation stops.   You have a fever.   You have decreased urine production.   You havepain or bleeding when urinating. MAKE SURE YOU:  Understand these instructions.  Will watch your condition.  Will get help right away if you are not doing well or get worse. FOR MORE INFORMATION   American Association of Kidney Patients: BombTimer.gl  National Kidney Foundation: www.kidney.[place]: https://mathis.com/  [facility]:  www.lifeoptions.org and www.kidneyschool.org Document Released: 06/21/2008 Document Revised: 01/27/2014 Document Reviewed: 05/11/2012 [facility] Patient Information 2015 [place], [place]. This information is not intended to replace advice given to you by your health care provider. Make sure you discuss any questions you have with your health care provider.

## 2015-01-09 LAB — C3 COMPLEMENT: C3 Complement: 109 mg/dL (ref 82–167)

## 2017-05-03 ENCOUNTER — Emergency Department (HOSPITAL_COMMUNITY)
Admission: EM | Admit: 2017-05-03 | Discharge: 2017-05-04 | Disposition: A | Discharge status: Home / Self Care | Payer: Self-pay | Attending: Emergency Medicine | Admitting: Emergency Medicine

## 2017-05-03 ENCOUNTER — Emergency Department (HOSPITAL_COMMUNITY): Payer: Self-pay

## 2017-05-03 DIAGNOSIS — S8262XA Displaced fracture of lateral malleolus of left fibula, initial encounter for closed fracture: Secondary | ICD-10-CM | POA: Insufficient documentation

## 2017-05-03 DIAGNOSIS — X509XXA Other and unspecified overexertion or strenuous movements or postures, initial encounter: Secondary | ICD-10-CM | POA: Insufficient documentation

## 2017-05-03 DIAGNOSIS — Y929 Unspecified place or not applicable: Secondary | ICD-10-CM | POA: Insufficient documentation

## 2017-05-03 DIAGNOSIS — Y9389 Activity, other specified: Secondary | ICD-10-CM | POA: Insufficient documentation

## 2017-05-03 DIAGNOSIS — W19XXXA Unspecified fall, initial encounter: Secondary | ICD-10-CM

## 2017-05-03 DIAGNOSIS — Y999 Unspecified external cause status: Secondary | ICD-10-CM | POA: Insufficient documentation

## 2017-05-03 MED ORDER — HYDROMORPHONE HCL 1 MG/ML IJ SOLN
1.0000 mg | Freq: Once | INTRAMUSCULAR | Status: AC
  Administered 2017-05-03: 1 mg via INTRAMUSCULAR
  Filled 2017-05-03: qty 1

## 2017-05-03 NOTE — ED Provider Notes (Signed)
[facility] DEPT Provider Note   CSN: [id number] Arrival date & time: 05/03/17  1644  By signing my name below, I, Margit Banda, attest that this documentation has been prepared under the direction and in the presence of Mia A. McDonald, PA-C. Electronically Signed: Margit Banda, ED Scribe. 05/03/17. 10:59 PM.  History   Chief Complaint No chief complaint on file.   HPI Adriana Tucker is a 36 y.o. female with a PMHx of a stroke, DM, renal failure, and lupus, who presents to the Emergency Department complaining of left ankle pain s/p a fall that occurred ~ 4 am today, 05/03/17. Associated sx include swelling. Pt states her whole leg gave out and she wanted to avoid hitting her head on the wall so she twisted her body and that's when she heard a popping noise. She hasn't been able to bare a lot of weight, but notes it was easier when her ankle was wrapped with an ace bandage. Pt is visiting her sister from [place] for three months and states she has been walking more than usual. She is normally in a motorized wheel chair at home. Pt had dialysis this morning for her lupus. Pt reports she had a stroke ~ 4 years ago and it has affected her left side. Pt denies LOC, head injury, or any other complaints at this time.   The history is provided by the patient. No language interpreter was used.    No past medical history on file.  There are no active problems to display for this patient.   No past surgical history on file.  OB History    No data available       Home Medications    Prior to Admission medications   Medication Sig Start Date End Date Taking? Authorizing Provider  oxyCODONE-acetaminophen (PERCOCET/ROXICET) 5-325 MG tablet Take 1 tablet by mouth every 4 (four) hours as needed for severe pain. 05/04/17   McDonald, Mia A, PA-C    Family History No family history on file.  Social History Social History  Substance Use Topics  . Smoking status: Not on file  . Smokeless  tobacco: Not on file  . Alcohol use Not on file     Allergies   Patient has no allergy information on record.   Review of Systems Review of Systems  Constitutional: Negative for activity change.  Respiratory: Negative for shortness of breath.   Cardiovascular: Negative for chest pain.  Gastrointestinal: Negative for abdominal pain.  Musculoskeletal: Positive for arthralgias and joint swelling. Negative for back pain.  Skin: Negative for rash.  Neurological: Negative for syncope and headaches.   Physical Exam Updated Vital Signs BP (!) 141/83 (BP Location: Right Arm)   Pulse 76   Temp 98.7 F (37.1 C) (Oral)   Resp 16   LMP 04/26/2017   SpO2 99%   Physical Exam  Constitutional: No distress.  HENT:  Head: Normocephalic.  Eyes: Conjunctivae are normal.  Neck: Neck supple.  Cardiovascular: Normal rate and regular rhythm.  Exam reveals no gallop and no friction rub.   No murmur heard. Pulmonary/Chest: Effort normal. No respiratory distress.  Abdominal: Soft. She exhibits no distension.  Musculoskeletal: She exhibits edema.  Pain is exacerbated with eversion. TTP over left lateral malleolus and left distal fibula. Swelling over the lateral ankle and the dorsum of the foot. 2+ DP and TTP pulses. Sensation intact. Good strength of toes against resistance. Decreased motor strength of the foot against resistance, which pt reports as baseline after  her CVA.   Neurological: She is alert.  Skin: Skin is warm. No rash noted.  Psychiatric: Her behavior is normal.  Nursing note and vitals reviewed.    ED Treatments / Results  DIAGNOSTIC STUDIES: Oxygen Saturation is 98% on RA, normal by my interpretation.   COORDINATION OF CARE: 10:59 PM-Discussed next steps with pt which includes a walking boot to stabilize her ankle. Pt verbalized understanding and is agreeable with the plan.   Labs (all labs ordered are listed, but only abnormal results are displayed) Labs Reviewed - No  data to display  EKG  EKG Interpretation None       Radiology Dg Ankle Complete Left  Result Date: 05/03/2017 CLINICAL DATA:  Left ankle pain after falling EXAM: LEFT ANKLE COMPLETE - 3+ VIEW COMPARISON:  None. FINDINGS: There is an oblique, intra-articular fracture of the left lateral malleolus with severe overlying soft tissue swelling. The ankle mortise remains approximated. No tibial fracture. IMPRESSION: Oblique, minimally displaced, intra-articular fracture of the lateral malleolus with severe overlying soft tissue swelling. Electronically Signed   By: Ulyses Jarred M.D.   On: 05/03/2017 18:33    Procedures Procedures (including critical care time)  Medications Ordered in ED Medications  HYDROmorphone (DILAUDID) injection 1 mg (1 mg Intramuscular Given 05/03/17 2359)     Initial Impression / Assessment and Plan / ED Course  I have reviewed the triage vital signs and the nursing notes.  Pertinent labs & imaging results that were available during my care of the patient were reviewed by me and considered in my medical decision making (see chart for details).     Patient X-Ray with oblique, minimally displaced, intra-articular fracture of the lateral malleolus with severe overlying soft tissue swelling. Discussed and evaluated the patient with Dr. Winfred Leeds, attending physician. Pain managed in ED. Pt advised to follow up with orthopedics for further evaluation and treatment.  Pain managed in the department. Patient given a camwalker while in ED, conservative therapy recommended and discussed. Patient will be dc home & is agreeable with above plan. I have also discussed reasons to return immediately to the ER.  Patient expresses understanding and agrees with plan.  Final Clinical Impressions(s) / ED Diagnoses   Final diagnoses:  Closed displaced fracture of lateral malleolus of left fibula, initial encounter    New Prescriptions There are no discharge medications for this  patient.  I personally performed the services described in this documentation, which was scribed in my presence. The recorded information has been reviewed and is accurate.     Joline Maxcy A, PA-C 05/04/17 0339    Joanne Gavel, PA-C 05/04/17 3612    Orlie Dakin, MD 05/04/17 [phone]

## 2017-05-03 NOTE — ED Provider Notes (Signed)
[age] AM today injuring left ankle. No other injury. On exam she is alert Glasgow Coma Score 15  tender overlying lateral malleolus 8 skin intact. Good capillary refill. X-rays viewed by me   Orlie Dakin, MD 05/03/17 2322

## 2017-05-04 MED ORDER — OXYCODONE-ACETAMINOPHEN 5-325 MG PO TABS: 1.0000 | ORAL_TABLET | ORAL | 0 refills | Status: DC | PRN

## 2017-05-04 NOTE — Discharge Instructions (Signed)
Please continue to wear your walking boot until you are seen by orthopaedics. Please call their office tomorrow morning to schedule a follow-up appointment. Please elevate the ankle about the level of your heart when possible. You may apply ice for 15-20 up to 3-4 times per day. You may continue to take your home pain medication for pain control.

## 2017-05-04 NOTE — Progress Notes (Signed)
Orthopedic Tech Progress Note Patient Details:  Adriana Tucker [birth year] [id number]  Ortho Devices Type of Ortho Device: CAM walker Ortho Device/Splint Location: lle Ortho Device/Splint Interventions: Ordered, Application, Adjustment   Karolee Stamps 05/04/2017, 12:44 AM

## 2017-06-20 ENCOUNTER — Other Ambulatory Visit: Payer: Self-pay

## 2017-06-20 DIAGNOSIS — T82598A Other mechanical complication of other cardiac and vascular devices and implants, initial encounter: Secondary | ICD-10-CM

## 2017-07-05 ENCOUNTER — Emergency Department (HOSPITAL_COMMUNITY)
Admission: EM | Admit: 2017-07-05 | Discharge: 2017-07-05 | Disposition: A | Discharge status: Home / Self Care | Payer: BLUE CROSS/BLUE SHIELD | Attending: Emergency Medicine | Admitting: Emergency Medicine

## 2017-07-05 ENCOUNTER — Encounter (HOSPITAL_COMMUNITY): Payer: Self-pay

## 2017-07-05 ENCOUNTER — Emergency Department (HOSPITAL_COMMUNITY): Payer: BLUE CROSS/BLUE SHIELD

## 2017-07-05 DIAGNOSIS — Y829 Unspecified medical devices associated with adverse incidents: Secondary | ICD-10-CM | POA: Insufficient documentation

## 2017-07-05 DIAGNOSIS — I120 Hypertensive chronic kidney disease with stage 5 chronic kidney disease or end stage renal disease: Secondary | ICD-10-CM | POA: Insufficient documentation

## 2017-07-05 DIAGNOSIS — Z992 Dependence on renal dialysis: Secondary | ICD-10-CM | POA: Diagnosis not present

## 2017-07-05 DIAGNOSIS — Z87891 Personal history of nicotine dependence: Secondary | ICD-10-CM | POA: Insufficient documentation

## 2017-07-05 DIAGNOSIS — E1122 Type 2 diabetes mellitus with diabetic chronic kidney disease: Secondary | ICD-10-CM | POA: Diagnosis not present

## 2017-07-05 DIAGNOSIS — T829XXA Unspecified complication of cardiac and vascular prosthetic device, implant and graft, initial encounter: Secondary | ICD-10-CM

## 2017-07-05 DIAGNOSIS — N186 End stage renal disease: Secondary | ICD-10-CM | POA: Diagnosis not present

## 2017-07-05 DIAGNOSIS — T8249XA Other complication of vascular dialysis catheter, initial encounter: Secondary | ICD-10-CM | POA: Insufficient documentation

## 2017-07-05 DIAGNOSIS — Z7984 Long term (current) use of oral hypoglycemic drugs: Secondary | ICD-10-CM | POA: Insufficient documentation

## 2017-07-05 DIAGNOSIS — Z79899 Other long term (current) drug therapy: Secondary | ICD-10-CM | POA: Diagnosis not present

## 2017-07-05 HISTORY — PX: IR REPAIR TUN/NON TUN CV CATH W/O FL: IMG2288

## 2017-07-05 MED ORDER — CEPHALEXIN 500 MG PO CAPS: 500.0000 mg | ORAL_CAPSULE | Freq: Four times a day (QID) | ORAL | 0 refills | Status: AC

## 2017-07-05 MED ORDER — HEPARIN SODIUM (PORCINE) 1000 UNIT/ML IJ SOLN
INTRAMUSCULAR | Status: AC
  Filled 2017-07-05: qty 1

## 2017-07-05 MED ORDER — CEPHALEXIN 250 MG PO CAPS
500.0000 mg | ORAL_CAPSULE | Freq: Once | ORAL | Status: AC
  Administered 2017-07-05: 500 mg via ORAL
  Filled 2017-07-05: qty 2

## 2017-07-05 NOTE — ED Notes (Signed)
Taken to IR

## 2017-07-05 NOTE — ED Triage Notes (Signed)
Pt arrives from dialysis via EMS. When accessing arterial graft at dialysis the needle broke off inside of permacath. The tech then attempted to remove needle with scissors and "cut" tip of catheter. Bleeding controlled. Pt was unable to receive dialysis.   110/68  Hr 93 spo2 96%

## 2017-07-05 NOTE — ED Notes (Signed)
Patient able to ambulate independently  

## 2017-07-05 NOTE — ED Provider Notes (Signed)
[facility] [facility] DEPT Provider Note   CSN: [id number] Arrival date & time: 07/05/17  1348     History   Chief Complaint Chief Complaint  Patient presents with  . Vascular Access Problem    HPI Adriana Tucker is a 36 y.o. female.  The history is provided by the patient and medical records. No language interpreter was used.  Wound Check  This is a new problem. The current episode started less than 1 hour ago. The problem occurs rarely. The problem has not changed since onset.Pertinent negatives include no chest pain, no abdominal pain, no headaches and no shortness of breath. Nothing aggravates the symptoms. Nothing relieves the symptoms. She has tried nothing for the symptoms. The treatment provided no relief.    Past Medical History:  Diagnosis Date  . Brain aneurysm   . CKD (chronic kidney disease)    due to lupus  . Hypertension   . Lupus     Patient Active Problem List   Diagnosis Date Noted  . Cephalalgia   . Exacerbation of systemic lupus ([place])   . Urinary retention   . Lupus nephritis ([place])   . Cerebral aneurysm rupture ([place])   . Intractable migraine without aura and with status migrainosus   . Acute on chronic renal failure ([place])   . Diabetes type 2, controlled ([place])   . Migraine without aura and without status migrainosus, not intractable   . Migraine without aura and with status migrainosus, not intractable   . Chronic kidney disease   . Diabetes type 2, uncontrolled ([place])   . HLD (hyperlipidemia)   . Hypertensive urgency 12/29/2014  . History of cerebral aneurysm repair 12/29/2014    Past Surgical History:  Procedure Laterality Date  . BACK SURGERY    . BRAIN SURGERY      OB History    No data available       Home Medications    Prior to Admission medications   Medication Sig Start Date End Date Taking? Authorizing Provider  atorvastatin (LIPITOR) 40 MG tablet Take 1 tablet (40 mg total) by mouth every evening. 01/08/15   Cherene Altes,  MD  buPROPion (WELLBUTRIN XL) 300 MG 24 hr tablet Take 300 mg by mouth daily.    [provider]  cloNIDine (CATAPRES) 0.2 MG tablet Take 2 tablets (0.4 mg total) by mouth 3 (three) times daily. 01/08/15   Cherene Altes, MD  furosemide (LASIX) 80 MG tablet Take 1 tablet (80 mg total) by mouth daily. 01/08/15   Cherene Altes, MD  gabapentin (NEURONTIN) 100 MG capsule Take 1 capsule (100 mg total) by mouth 3 (three) times daily. 01/08/15   Cherene Altes, MD  glipiZIDE (GLUCOTROL) 5 MG tablet Take 0.5 tablets (2.5 mg total) by mouth daily before breakfast. 01/08/15   Cherene Altes, MD  hydrALAZINE (APRESOLINE) 100 MG tablet Take 1 tablet (100 mg total) by mouth every 8 (eight) hours. 01/08/15   Cherene Altes, MD  metoprolol succinate (TOPROL-XL) 100 MG 24 hr tablet Take 1 tablet (100 mg total) by mouth 2 (two) times daily. Take with or immediately following a meal. 01/05/15   Cherene Altes, MD  Multiple Vitamins-Minerals (MULTIVITAMIN WITH MINERALS) tablet Take 1 tablet by mouth daily.    [provider]  mycophenolate (CELLCEPT) 250 MG capsule Take 1,000 mg by mouth every 12 (twelve) hours.    [provider]  oxyCODONE-acetaminophen (PERCOCET/ROXICET) 5-325 MG per tablet Take 1 tablet by mouth 2 (two)  times daily as needed for severe pain (Maximum dose is 2 tablets per day).    [provider]  oxyCODONE-acetaminophen (PERCOCET/ROXICET) 5-325 MG tablet Take 1 tablet by mouth every 4 (four) hours as needed for severe pain. 05/04/17   McDonald, Mia A, PA-C  predniSONE (DELTASONE) 10 MG tablet Take 10 mg by mouth 2 (two) times daily with a meal.    [provider]  tolterodine (DETROL) 2 MG tablet Take 4 mg by mouth 2 (two) times daily.    [provider]    Family History No family history on file.  Social History Social History  Substance Use Topics  . Smoking status: Former Research scientist (life sciences)  . Smokeless tobacco: Never Used  .  Alcohol use No     Allergies   Patient has no known allergies.   Review of Systems Review of Systems  Constitutional: Negative for chills, diaphoresis, fatigue and fever.  HENT: Negative for congestion, ear pain and sore throat.   Eyes: Negative for pain and visual disturbance.  Respiratory: Negative for cough, chest tightness, shortness of breath, wheezing and stridor.   Cardiovascular: Negative for chest pain and palpitations.  Gastrointestinal: Negative for abdominal pain and vomiting.  Genitourinary: Negative for dysuria, flank pain and hematuria.  Musculoskeletal: Negative for arthralgias and back pain.  Skin: Negative for color change and rash.  Neurological: Negative for tremors, seizures, syncope, weakness and headaches.  Psychiatric/Behavioral: Negative for agitation.  All other systems reviewed and are negative.    Physical Exam Updated Vital Signs BP 121/80 (BP Location: Right Arm)   Pulse 89   Temp 98.7 F (37.1 C) (Oral)   Resp 18   SpO2 100%   Physical Exam  Constitutional: She is oriented to person, place, and time. She appears well-developed and well-nourished. No distress.  HENT:  Head: Normocephalic and atraumatic.  Mouth/Throat: Oropharynx is clear and moist.  Eyes: Pupils are equal, round, and reactive to light. Conjunctivae are normal.  Neck: Neck supple.  Cardiovascular: Normal rate, regular rhythm and intact distal pulses.   No murmur heard. Pulmonary/Chest: Effort normal and breath sounds normal. No respiratory distress. She has no wheezes. She exhibits no tenderness.    Abdominal: Soft. There is no tenderness.  Musculoskeletal: She exhibits no edema or tenderness.  Neurological: She is alert and oriented to person, place, and time. No sensory deficit. She exhibits normal muscle tone.  Skin: Skin is warm and dry. No rash noted. She is not diaphoretic. No erythema.  Psychiatric: She has a normal mood and affect.  Nursing note and vitals  reviewed.    ED Treatments / Results  Labs (all labs ordered are listed, but only abnormal results are displayed) Labs Reviewed - No data to display  EKG  EKG Interpretation None       Radiology No results found.  Procedures Procedures (including critical care time)  Medications Ordered in ED Medications  heparin 1000 UNIT/ML injection (not administered)  cephALEXin (KEFLEX) capsule 500 mg (not administered)     Initial Impression / Assessment and Plan / ED Course  I have reviewed the triage vital signs and the nursing notes.  Pertinent labs & imaging results that were available during my care of the patient were reviewed by me and considered in my medical decision making (see chart for details).     Adriana Tucker is a 36 y.o. female with past medical history significant for ESRD on dialysis, diabetes, lupus, cerebral aneurysm, and hyperlipidemia who presents with dialysis catheter  problem. Patient reports that she was at dialysis center today when they were drawing blood and a piece of the vacuum container got stuck in her catheter. She reports that one of the team members tried to get it out with a scissors/hemostat and instead broke off the tip going into the catheter. When they were unable to retrieve the broken piece, the covered it up and sent the patient to the ED for evaluation. Patient denies any chest pain, shortness of breath, or any other complaints. She denies any preceding symptoms.  On exam, patient has a dialysis catheter in her right upper chest. Patient has one of the hopes covered with gauze and tape. This was removed and inspected by me. I was unable to determine what pieces or piece was buried inside the house. It was recovered and lungs were otherwise clear and chest is nontender. Exam otherwise unremarkable. Next  Based on chief complaint, the nephrology team was called. They sent a representative down to the ED who recommended speaking with  interventional radiology for evaluation and management. Interventional radiology came and feel that they may be able to replace the home without having to take the entire catheter out. If they're unable to, will likely need to do a full exchange of the dialysis catheter.  Nephrology recommended starting antibiotics at discharge due to the exposed catheter for a period of time. They recommended either Keflex or doxycycline for coverage. Patient will be reevaluated after return from IR to determine disposition however suspect patient will be stable for discharge.    3:29 PM Patient returned from IR and they were successful in replacing the port hub. Patient will be given 1 dose of Keflex and discharged on Keflex. Patient will follow with her PCP and her dialysis center. Patient understood return precautions for any sinus symptoms of infection. Patient had no questions or concerns and patient discharged in good condition.    Final Clinical Impressions(s) / ED Diagnoses   Final diagnoses:  Complication of vascular dialysis catheter  Other mechanical complication of vascular dialysis catheter, initial encounter [facility])    New Prescriptions New Prescriptions   CEPHALEXIN (KEFLEX) 500 MG CAPSULE    Take 1 capsule (500 mg total) by mouth 4 (four) times daily.    Clinical Impression: 1. Other mechanical complication of vascular dialysis catheter, initial encounter ([place])   2. Complication of vascular dialysis catheter   3. ESRD (end stage renal disease) ([place])     Disposition: Discharge  Condition: Good  I have discussed the results, Dx and Tx plan with the pt(& family if present). He/she/they expressed understanding and agree(s) with the plan. Discharge instructions discussed at great length. Strict return precautions discussed and pt &/or family have verbalized understanding of the instructions. No further questions at time of discharge.    New Prescriptions   CEPHALEXIN (KEFLEX) 500 MG  CAPSULE    Take 1 capsule (500 mg total) by mouth 4 (four) times daily.    Follow Up: [place] [address] [id number] [facility] [place] [phone]  If symptoms worsen     Tegeler, Gwenyth Allegra, MD 07/05/17 [phone]

## 2017-07-05 NOTE — Discharge Instructions (Signed)
They were able to replace your dialysis port today. Nephrology recommended antibiotics for the next week. Please take them to prevent infection. If you begin developing any signs or symptoms of infection, please return immediately to the nearest emergency department.

## 2017-07-06 ENCOUNTER — Encounter: Payer: Self-pay | Admitting: Vascular Surgery

## 2017-07-06 ENCOUNTER — Encounter (HOSPITAL_COMMUNITY): Payer: Self-pay

## 2018-10-01 ENCOUNTER — Emergency Department (HOSPITAL_COMMUNITY)
Admission: EM | Admit: 2018-10-01 | Discharge: 2018-10-01 | Disposition: A | Discharge status: Home / Self Care | Payer: 59 | Attending: Emergency Medicine | Admitting: Emergency Medicine

## 2018-10-01 ENCOUNTER — Encounter: Payer: Self-pay | Admitting: Emergency Medicine

## 2018-10-01 DIAGNOSIS — S00212A Abrasion of left eyelid and periocular area, initial encounter: Secondary | ICD-10-CM | POA: Diagnosis not present

## 2018-10-01 DIAGNOSIS — S058X2A Other injuries of left eye and orbit, initial encounter: Secondary | ICD-10-CM

## 2018-10-01 DIAGNOSIS — Z79899 Other long term (current) drug therapy: Secondary | ICD-10-CM | POA: Insufficient documentation

## 2018-10-01 DIAGNOSIS — I10 Essential (primary) hypertension: Secondary | ICD-10-CM | POA: Insufficient documentation

## 2018-10-01 DIAGNOSIS — E119 Type 2 diabetes mellitus without complications: Secondary | ICD-10-CM | POA: Insufficient documentation

## 2018-10-01 DIAGNOSIS — W050XXA Fall from non-moving wheelchair, initial encounter: Secondary | ICD-10-CM | POA: Insufficient documentation

## 2018-10-01 DIAGNOSIS — N179 Acute kidney failure, unspecified: Secondary | ICD-10-CM | POA: Diagnosis not present

## 2018-10-01 DIAGNOSIS — Y929 Unspecified place or not applicable: Secondary | ICD-10-CM | POA: Diagnosis not present

## 2018-10-01 DIAGNOSIS — Y939 Activity, unspecified: Secondary | ICD-10-CM | POA: Insufficient documentation

## 2018-10-01 DIAGNOSIS — Z992 Dependence on renal dialysis: Secondary | ICD-10-CM | POA: Diagnosis not present

## 2018-10-01 DIAGNOSIS — Y999 Unspecified external cause status: Secondary | ICD-10-CM | POA: Diagnosis not present

## 2018-10-01 DIAGNOSIS — S0990XA Unspecified injury of head, initial encounter: Secondary | ICD-10-CM | POA: Diagnosis present

## 2018-10-01 DIAGNOSIS — Z87891 Personal history of nicotine dependence: Secondary | ICD-10-CM | POA: Insufficient documentation

## 2018-10-01 DIAGNOSIS — S0502XA Injury of conjunctiva and corneal abrasion without foreign body, left eye, initial encounter: Secondary | ICD-10-CM | POA: Diagnosis not present

## 2018-10-01 DIAGNOSIS — W19XXXA Unspecified fall, initial encounter: Secondary | ICD-10-CM

## 2018-10-01 MED ORDER — ERYTHROMYCIN 5 MG/GM OP OINT
1.0000 "application " | TOPICAL_OINTMENT | Freq: Once | OPHTHALMIC | Status: AC
  Administered 2018-10-01: 1 via OPHTHALMIC
  Filled 2018-10-01: qty 3.5

## 2018-10-01 MED ORDER — FLUORESCEIN SODIUM 1 MG OP STRP
1.0000 | ORAL_STRIP | Freq: Once | OPHTHALMIC | Status: AC
  Administered 2018-10-01: 1 via OPHTHALMIC
  Filled 2018-10-01: qty 1

## 2018-10-01 NOTE — Discharge Instructions (Addendum)
1.  You have a small scratch to your eye and eyelid.  Put erythromycin ointment as provided in the emergency department in the lower eyelid 3 times a day and a small amount on the abrasion on your eyelid.  Continue to do this for the next 5 to 7 days.  If there is any worsening you should be seen by an ophthalmologist. 2.  Go to dialysis tomorrow as rescheduled. 3.  Return to the emergency department if you have any new, worsening symptoms or other concerns.

## 2018-10-01 NOTE — ED Provider Notes (Signed)
[facility] EMERGENCY DEPARTMENT Provider Note   CSN: [id number] Arrival date & time: 10/01/18  1423     History   Chief Complaint Chief Complaint  Patient presents with  . Fall    HPI Adriana Tucker is a 38 y.o. female.  HPI Patient reports that she was at dialysis.  She had not started her session yet and was in a motorized wheelchair trying to go to the bathroom.  She reports that the chair is balanced such that if her weight goes forward it tips.  This caused her to tip out of the chair and hit her eye on the toilet paper holder as she fell.  Patient denies that she had any loss of consciousness.  She reports she has some pain to the corner of her eye but otherwise no other new injury or pain.  She reports her dialysis providers have already rescheduled her for tomorrow.  She is not having any problems today with shortness of breath or increased swelling.  She feels that she is fine to go to her session tomorrow. Past Medical History:  Diagnosis Date  . Brain aneurysm   . CKD (chronic kidney disease)    due to lupus  . Hypertension   . Lupus [facility])     Patient Active Problem List   Diagnosis Date Noted  . Cephalalgia   . Exacerbation of systemic lupus ([place])   . Urinary retention   . Lupus nephritis ([place])   . Cerebral aneurysm rupture ([place])   . Intractable migraine without aura and with status migrainosus   . Acute on chronic renal failure ([place])   . Diabetes type 2, controlled ([place])   . Migraine without aura and without status migrainosus, not intractable   . Migraine without aura and with status migrainosus, not intractable   . Chronic kidney disease   . Diabetes type 2, uncontrolled ([place])   . HLD (hyperlipidemia)   . Hypertensive urgency 12/29/2014  . History of cerebral aneurysm repair 12/29/2014    Past Surgical History:  Procedure Laterality Date  . BACK SURGERY    . BRAIN SURGERY    . IR REPAIR TUN/NON TUN CV CATH W/O FL  07/05/2017      OB History   No obstetric history on file.      Home Medications    Prior to Admission medications   Medication Sig Start Date End Date Taking? Authorizing Provider  atorvastatin (LIPITOR) 40 MG tablet Take 1 tablet (40 mg total) by mouth every evening. 01/08/15   Cherene Altes, MD  buPROPion (WELLBUTRIN XL) 300 MG 24 hr tablet Take 300 mg by mouth daily.    [provider]  cloNIDine (CATAPRES) 0.2 MG tablet Take 2 tablets (0.4 mg total) by mouth 3 (three) times daily. 01/08/15   Cherene Altes, MD  furosemide (LASIX) 80 MG tablet Take 1 tablet (80 mg total) by mouth daily. 01/08/15   Cherene Altes, MD  gabapentin (NEURONTIN) 100 MG capsule Take 1 capsule (100 mg total) by mouth 3 (three) times daily. 01/08/15   Cherene Altes, MD  glipiZIDE (GLUCOTROL) 5 MG tablet Take 0.5 tablets (2.5 mg total) by mouth daily before breakfast. 01/08/15   Cherene Altes, MD  hydrALAZINE (APRESOLINE) 100 MG tablet Take 1 tablet (100 mg total) by mouth every 8 (eight) hours. 01/08/15   Cherene Altes, MD  metoprolol succinate (TOPROL-XL) 100 MG 24 hr tablet Take 1 tablet (100 mg total) by mouth  2 (two) times daily. Take with or immediately following a meal. 01/05/15   Cherene Altes, MD  Multiple Vitamins-Minerals (MULTIVITAMIN WITH MINERALS) tablet Take 1 tablet by mouth daily.    [provider]  mycophenolate (CELLCEPT) 250 MG capsule Take 1,000 mg by mouth every 12 (twelve) hours.    [provider]  oxyCODONE-acetaminophen (PERCOCET/ROXICET) 5-325 MG per tablet Take 1 tablet by mouth 2 (two) times daily as needed for severe pain (Maximum dose is 2 tablets per day).    [provider]  oxyCODONE-acetaminophen (PERCOCET/ROXICET) 5-325 MG tablet Take 1 tablet by mouth every 4 (four) hours as needed for severe pain. 05/04/17   McDonald, Mia A, PA-C  predniSONE (DELTASONE) 10 MG tablet Take 10 mg by mouth 2 (two) times daily with a meal.     [provider]  tolterodine (DETROL) 2 MG tablet Take 4 mg by mouth 2 (two) times daily.    [provider]    Family History History reviewed. No pertinent family history.  Social History Social History   Tobacco Use  . Smoking status: Former Research scientist (life sciences)  . Smokeless tobacco: Never Used  Substance Use Topics  . Alcohol use: No  . Drug use: No     Allergies   Patient has no known allergies.   Review of Systems Review of Systems 10 Systems reviewed and are negative for acute change except as noted in the HPI.   Physical Exam Updated Vital Signs BP (!) 141/99   Pulse 64   Temp 97.7 F (36.5 C) (Oral)   Resp 11   Ht 5\' 1"  (1.549 m)   Wt 72.6 kg   LMP 10/01/2017 Comment: states periods have stopped from dialysis  SpO2 100%   BMI 30.23 kg/m   Physical Exam Constitutional:      Comments: Patient is alert and in no acute distress.  No respiratory distress.  Mental status clear.  HENT:     Head:     Comments: Patient has a small limited abrasion to the left upper eyelid and corneal abrasion as seen in attached image.  Face otherwise atraumatic.    Nose: Nose normal.     Mouth/Throat:     Mouth: Mucous membranes are moist.  Eyes:     Comments: Patient has subconjunctival hematoma medially in the left eye.  Extraocular motions are normal.  Fluorescein stain shows no uptake over the cornea.  Slight uptake over the medial aspect of the cornea.  Neck:     Musculoskeletal: Neck supple.  Cardiovascular:     Comments: Heart is regular.  3-4\7 systolic ejection murmur. Pulmonary:     Comments: Lungs are clear.  No respiratory distress. Musculoskeletal:     Right lower leg: No edema.     Left lower leg: No edema.  Skin:    General: Skin is warm and dry.  Neurological:     Mental Status: She is oriented to person, place, and time.  Psychiatric:        Mood and Affect: Mood normal.        ED Treatments / Results  Labs (all labs ordered are listed,  but only abnormal results are displayed) Labs Reviewed - No data to display  EKG None  Radiology No results found.  Procedures Procedures (including critical care time)  Medications Ordered in ED Medications  fluorescein ophthalmic strip 1 strip (1 strip Left Eye Given 10/01/18 1650)  erythromycin ophthalmic ointment 1 application (1 application Left Eye Given 10/01/18  1650)     Initial Impression / Assessment and Plan / ED Course  I have reviewed the triage vital signs and the nursing notes.  Pertinent labs & imaging results that were available during my care of the patient were reviewed by me and considered in my medical decision making (see chart for details).    Patient fell forward out of the wheelchair.  She caught the corner of her eye on the toilet paper holder.  There is minor injury.  No evidence of globe trauma.  Patient did not have other injury from the fall.  She is stable for her dialysis tomorrow.  Return precautions reviewed.  Patient is given erythromycin ophthalmic ointment in the emergency department with instructions to use 3 times a day.  Final Clinical Impressions(s) / ED Diagnoses   Final diagnoses:  Fall, initial encounter  Abrasion of sclera of left eye, initial encounter  Abrasion of left eyelid, initial encounter    ED Discharge Orders    None       Charlesetta Shanks, MD 10/01/18 1658

## 2018-10-01 NOTE — ED Notes (Signed)
Discharge instructions and medications discussed with Pt. Pt verbalized understanding. Pt stable and leaving via wc.

## 2018-10-01 NOTE — ED Triage Notes (Signed)
Pt here via [facility], pt was at dialysis, went to the bathroom with her wheelchair but fell forward hitting her right eye, states the wheelchair gets "front heavy" and just tipped pver, denies LOC.  Swelling and small laceration to right eye lid.  Pt did not receive dialysis txt.  Pt A&O x4.

## 2021-08-12 ENCOUNTER — Emergency Department (HOSPITAL_COMMUNITY): Payer: Medicare Other

## 2021-08-12 ENCOUNTER — Inpatient Hospital Stay (HOSPITAL_COMMUNITY)
Admission: EM | Admit: 2021-08-12 | Discharge: 2021-08-26 | DRG: 870 | Disposition: E | Payer: Medicare Other | Attending: Pulmonary Disease | Admitting: Pulmonary Disease

## 2021-08-12 ENCOUNTER — Other Ambulatory Visit: Payer: Self-pay

## 2021-08-12 ENCOUNTER — Inpatient Hospital Stay (HOSPITAL_COMMUNITY): Payer: Medicare Other

## 2021-08-12 DIAGNOSIS — E2740 Unspecified adrenocortical insufficiency: Secondary | ICD-10-CM | POA: Diagnosis present

## 2021-08-12 DIAGNOSIS — J690 Pneumonitis due to inhalation of food and vomit: Secondary | ICD-10-CM | POA: Diagnosis present

## 2021-08-12 DIAGNOSIS — T82868A Thrombosis of vascular prosthetic devices, implants and grafts, initial encounter: Secondary | ICD-10-CM | POA: Diagnosis not present

## 2021-08-12 DIAGNOSIS — D6959 Other secondary thrombocytopenia: Secondary | ICD-10-CM | POA: Diagnosis present

## 2021-08-12 DIAGNOSIS — I69354 Hemiplegia and hemiparesis following cerebral infarction affecting left non-dominant side: Secondary | ICD-10-CM | POA: Diagnosis not present

## 2021-08-12 DIAGNOSIS — D61818 Other pancytopenia: Secondary | ICD-10-CM | POA: Diagnosis present

## 2021-08-12 DIAGNOSIS — N3090 Cystitis, unspecified without hematuria: Secondary | ICD-10-CM | POA: Diagnosis present

## 2021-08-12 DIAGNOSIS — Z992 Dependence on renal dialysis: Secondary | ICD-10-CM

## 2021-08-12 DIAGNOSIS — R188 Other ascites: Secondary | ICD-10-CM | POA: Diagnosis present

## 2021-08-12 DIAGNOSIS — G9340 Encephalopathy, unspecified: Secondary | ICD-10-CM | POA: Diagnosis not present

## 2021-08-12 DIAGNOSIS — R627 Adult failure to thrive: Secondary | ICD-10-CM | POA: Diagnosis present

## 2021-08-12 DIAGNOSIS — R6521 Severe sepsis with septic shock: Secondary | ICD-10-CM | POA: Diagnosis present

## 2021-08-12 DIAGNOSIS — Z79899 Other long term (current) drug therapy: Secondary | ICD-10-CM

## 2021-08-12 DIAGNOSIS — Z79624 Long term (current) use of inhibitors of nucleotide synthesis: Secondary | ICD-10-CM

## 2021-08-12 DIAGNOSIS — A4152 Sepsis due to Pseudomonas: Principal | ICD-10-CM | POA: Diagnosis present

## 2021-08-12 DIAGNOSIS — Z87891 Personal history of nicotine dependence: Secondary | ICD-10-CM

## 2021-08-12 DIAGNOSIS — Z7952 Long term (current) use of systemic steroids: Secondary | ICD-10-CM

## 2021-08-12 DIAGNOSIS — I120 Hypertensive chronic kidney disease with stage 5 chronic kidney disease or end stage renal disease: Secondary | ICD-10-CM | POA: Diagnosis present

## 2021-08-12 DIAGNOSIS — J151 Pneumonia due to Pseudomonas: Secondary | ICD-10-CM | POA: Diagnosis present

## 2021-08-12 DIAGNOSIS — E876 Hypokalemia: Secondary | ICD-10-CM | POA: Diagnosis present

## 2021-08-12 DIAGNOSIS — J9602 Acute respiratory failure with hypercapnia: Secondary | ICD-10-CM | POA: Diagnosis not present

## 2021-08-12 DIAGNOSIS — E872 Acidosis, unspecified: Secondary | ICD-10-CM | POA: Diagnosis present

## 2021-08-12 DIAGNOSIS — K219 Gastro-esophageal reflux disease without esophagitis: Secondary | ICD-10-CM | POA: Diagnosis present

## 2021-08-12 DIAGNOSIS — Y832 Surgical operation with anastomosis, bypass or graft as the cause of abnormal reaction of the patient, or of later complication, without mention of misadventure at the time of the procedure: Secondary | ICD-10-CM | POA: Diagnosis not present

## 2021-08-12 DIAGNOSIS — D84821 Immunodeficiency due to drugs: Secondary | ICD-10-CM | POA: Diagnosis present

## 2021-08-12 DIAGNOSIS — E1122 Type 2 diabetes mellitus with diabetic chronic kidney disease: Secondary | ICD-10-CM | POA: Diagnosis present

## 2021-08-12 DIAGNOSIS — N186 End stage renal disease: Secondary | ICD-10-CM | POA: Diagnosis present

## 2021-08-12 DIAGNOSIS — R579 Shock, unspecified: Secondary | ICD-10-CM

## 2021-08-12 DIAGNOSIS — E11649 Type 2 diabetes mellitus with hypoglycemia without coma: Secondary | ICD-10-CM | POA: Diagnosis present

## 2021-08-12 DIAGNOSIS — J9601 Acute respiratory failure with hypoxia: Secondary | ICD-10-CM | POA: Insufficient documentation

## 2021-08-12 DIAGNOSIS — J9690 Respiratory failure, unspecified, unspecified whether with hypoxia or hypercapnia: Secondary | ICD-10-CM

## 2021-08-12 DIAGNOSIS — M329 Systemic lupus erythematosus, unspecified: Secondary | ICD-10-CM | POA: Diagnosis not present

## 2021-08-12 DIAGNOSIS — Z7984 Long term (current) use of oral hypoglycemic drugs: Secondary | ICD-10-CM

## 2021-08-12 DIAGNOSIS — K567 Ileus, unspecified: Secondary | ICD-10-CM | POA: Diagnosis not present

## 2021-08-12 DIAGNOSIS — R0902 Hypoxemia: Secondary | ICD-10-CM

## 2021-08-12 DIAGNOSIS — A419 Sepsis, unspecified organism: Secondary | ICD-10-CM

## 2021-08-12 DIAGNOSIS — J80 Acute respiratory distress syndrome: Secondary | ICD-10-CM | POA: Diagnosis present

## 2021-08-12 DIAGNOSIS — N2581 Secondary hyperparathyroidism of renal origin: Secondary | ICD-10-CM | POA: Diagnosis present

## 2021-08-12 DIAGNOSIS — M3214 Glomerular disease in systemic lupus erythematosus: Secondary | ICD-10-CM | POA: Diagnosis present

## 2021-08-12 DIAGNOSIS — Z20822 Contact with and (suspected) exposure to covid-19: Secondary | ICD-10-CM | POA: Diagnosis present

## 2021-08-12 DIAGNOSIS — Z4659 Encounter for fitting and adjustment of other gastrointestinal appliance and device: Secondary | ICD-10-CM

## 2021-08-12 DIAGNOSIS — I330 Acute and subacute infective endocarditis: Secondary | ICD-10-CM | POA: Diagnosis not present

## 2021-08-12 DIAGNOSIS — D631 Anemia in chronic kidney disease: Secondary | ICD-10-CM | POA: Diagnosis present

## 2021-08-12 DIAGNOSIS — N179 Acute kidney failure, unspecified: Secondary | ICD-10-CM | POA: Diagnosis not present

## 2021-08-12 DIAGNOSIS — I248 Other forms of acute ischemic heart disease: Secondary | ICD-10-CM | POA: Diagnosis present

## 2021-08-12 DIAGNOSIS — G9341 Metabolic encephalopathy: Secondary | ICD-10-CM | POA: Diagnosis present

## 2021-08-12 DIAGNOSIS — J9600 Acute respiratory failure, unspecified whether with hypoxia or hypercapnia: Secondary | ICD-10-CM

## 2021-08-12 DIAGNOSIS — Z978 Presence of other specified devices: Secondary | ICD-10-CM

## 2021-08-12 DIAGNOSIS — I050 Rheumatic mitral stenosis: Secondary | ICD-10-CM | POA: Diagnosis not present

## 2021-08-12 DIAGNOSIS — I5030 Unspecified diastolic (congestive) heart failure: Secondary | ICD-10-CM | POA: Diagnosis not present

## 2021-08-12 HISTORY — DX: Other pancytopenia: D61.818

## 2021-08-12 HISTORY — DX: End stage renal disease: N18.6

## 2021-08-12 HISTORY — DX: Nontraumatic subarachnoid hemorrhage, unspecified: I60.9

## 2021-08-12 HISTORY — DX: Disease of pericardium, unspecified: I31.9

## 2021-08-12 LAB — BASIC METABOLIC PANEL
Anion gap: 13 (ref 5–15)
BUN: 41 mg/dL — ABNORMAL HIGH (ref 6–20)
CO2: 16 mmol/L — ABNORMAL LOW (ref 22–32)
Calcium: 7.4 mg/dL — ABNORMAL LOW (ref 8.9–10.3)
Chloride: 100 mmol/L (ref 98–111)
Creatinine, Ser: 6.08 mg/dL — ABNORMAL HIGH (ref 0.44–1.00)
GFR, Estimated: 8 mL/min — ABNORMAL LOW (ref >60)
Glucose, Bld: 158 mg/dL — ABNORMAL HIGH (ref 70–99)
Potassium: 4.6 mmol/L (ref 3.5–5.1)
Sodium: 129 mmol/L — ABNORMAL LOW (ref 135–145)

## 2021-08-12 LAB — CBC WITH DIFFERENTIAL/PLATELET
Abs Immature Granulocytes: 0 10*3/uL (ref 0.00–0.07)
Basophils Absolute: 0 10*3/uL (ref 0.0–0.1)
Basophils Relative: 0 %
Eosinophils Absolute: 0 10*3/uL (ref 0.0–0.5)
Eosinophils Relative: 0 %
HCT: 25.3 % — ABNORMAL LOW (ref 36.0–46.0)
Hemoglobin: 7.7 g/dL — ABNORMAL LOW (ref 12.0–15.0)
Lymphocytes Relative: 25 %
Lymphs Abs: 0.5 10*3/uL — ABNORMAL LOW (ref 0.7–4.0)
MCH: 28.2 pg (ref 26.0–34.0)
MCHC: 30.4 g/dL (ref 30.0–36.0)
MCV: 92.7 fL (ref 80.0–100.0)
Monocytes Absolute: 0.1 10*3/uL (ref 0.1–1.0)
Monocytes Relative: 5 %
Neutro Abs: 1.4 10*3/uL — ABNORMAL LOW (ref 1.7–7.7)
Neutrophils Relative %: 70 %
Platelets: 50 10*3/uL — ABNORMAL LOW (ref 150–400)
RBC: 2.73 MIL/uL — ABNORMAL LOW (ref 3.87–5.11)
RDW: 15.5 % (ref 11.5–15.5)
Smear Review: NORMAL
WBC: 2 10*3/uL — ABNORMAL LOW (ref 4.0–10.5)
nRBC: 0 % (ref 0.0–0.2)

## 2021-08-12 LAB — I-STAT ARTERIAL BLOOD GAS, ED
Acid-base deficit: 2 mmol/L (ref 0.0–2.0)
Bicarbonate: 23.5 mmol/L (ref 20.0–28.0)
Calcium, Ion: 1.01 mmol/L — ABNORMAL LOW (ref 1.15–1.40)
HCT: 24 % — ABNORMAL LOW (ref 36.0–46.0)
Hemoglobin: 8.2 g/dL — ABNORMAL LOW (ref 12.0–15.0)
O2 Saturation: 95 %
Patient temperature: 97.5
Potassium: 2.6 mmol/L — CL (ref 3.5–5.1)
Sodium: 137 mmol/L (ref 135–145)
TCO2: 25 mmol/L (ref 22–32)
pCO2 arterial: 40.4 mmHg (ref 32.0–48.0)
pH, Arterial: 7.37 (ref 7.350–7.450)
pO2, Arterial: 76 mmHg — ABNORMAL LOW (ref 83.0–108.0)

## 2021-08-12 LAB — COMPREHENSIVE METABOLIC PANEL
ALT: 16 U/L (ref 0–44)
AST: 17 U/L (ref 15–41)
Albumin: 2 g/dL — ABNORMAL LOW (ref 3.5–5.0)
Alkaline Phosphatase: 33 U/L — ABNORMAL LOW (ref 38–126)
Anion gap: 11 (ref 5–15)
BUN: 37 mg/dL — ABNORMAL HIGH (ref 6–20)
CO2: 23 mmol/L (ref 22–32)
Calcium: 7.4 mg/dL — ABNORMAL LOW (ref 8.9–10.3)
Chloride: 102 mmol/L (ref 98–111)
Creatinine, Ser: 6.37 mg/dL — ABNORMAL HIGH (ref 0.44–1.00)
GFR, Estimated: 8 mL/min — ABNORMAL LOW (ref >60)
Glucose, Bld: 86 mg/dL (ref 70–99)
Potassium: 2.9 mmol/L — ABNORMAL LOW (ref 3.5–5.1)
Sodium: 136 mmol/L (ref 135–145)
Total Bilirubin: 1.1 mg/dL (ref 0.3–1.2)
Total Protein: 4 g/dL — ABNORMAL LOW (ref 6.5–8.1)

## 2021-08-12 LAB — I-STAT CHEM 8, ED
BUN: 32 mg/dL — ABNORMAL HIGH (ref 6–20)
Calcium, Ion: 0.94 mmol/L — ABNORMAL LOW (ref 1.15–1.40)
Chloride: 100 mmol/L (ref 98–111)
Creatinine, Ser: 6.8 mg/dL — ABNORMAL HIGH (ref 0.44–1.00)
Glucose, Bld: 82 mg/dL (ref 70–99)
HCT: 22 % — ABNORMAL LOW (ref 36.0–46.0)
Hemoglobin: 7.5 g/dL — ABNORMAL LOW (ref 12.0–15.0)
Potassium: 2.8 mmol/L — ABNORMAL LOW (ref 3.5–5.1)
Sodium: 137 mmol/L (ref 135–145)
TCO2: 22 mmol/L (ref 22–32)

## 2021-08-12 LAB — GLUCOSE, CAPILLARY
Glucose-Capillary: 144 mg/dL — ABNORMAL HIGH (ref 70–99)
Glucose-Capillary: 155 mg/dL — ABNORMAL HIGH (ref 70–99)
Glucose-Capillary: 70 mg/dL (ref 70–99)

## 2021-08-12 LAB — MRSA NEXT GEN BY PCR, NASAL: MRSA by PCR Next Gen: NOT DETECTED

## 2021-08-12 LAB — CBG MONITORING, ED: Glucose-Capillary: 83 mg/dL (ref 70–99)

## 2021-08-12 LAB — TSH: TSH: 2.4 u[IU]/mL (ref 0.350–4.500)

## 2021-08-12 LAB — I-STAT BETA HCG BLOOD, ED (MC, WL, AP ONLY): I-stat hCG, quantitative: 7.4 m[IU]/mL — ABNORMAL HIGH (ref <5)

## 2021-08-12 LAB — HIV ANTIBODY (ROUTINE TESTING W REFLEX): HIV Screen 4th Generation wRfx: NONREACTIVE

## 2021-08-12 LAB — TROPONIN I (HIGH SENSITIVITY)
Troponin I (High Sensitivity): 49 ng/L — ABNORMAL HIGH (ref <18)
Troponin I (High Sensitivity): 129 ng/L (ref <18)

## 2021-08-12 LAB — RESP PANEL BY RT-PCR (FLU A&B, COVID) ARPGX2
Influenza A by PCR: NEGATIVE
Influenza B by PCR: NEGATIVE
SARS Coronavirus 2 by RT PCR: NEGATIVE

## 2021-08-12 LAB — AMMONIA: Ammonia: 17 umol/L (ref 9–35)

## 2021-08-12 LAB — PROCALCITONIN: Procalcitonin: 37.81 ng/mL

## 2021-08-12 LAB — LACTIC ACID, PLASMA
Lactic Acid, Venous: 2.7 mmol/L (ref 0.5–1.9)
Lactic Acid, Venous: 2.2 mmol/L (ref 0.5–1.9)

## 2021-08-12 LAB — CORTISOL: Cortisol, Plasma: 19.8 ug/dL

## 2021-08-12 MED ORDER — DOCUSATE SODIUM 50 MG/5ML PO LIQD
100.0000 mg | Freq: Two times a day (BID) | ORAL | Status: DC
  Administered 2021-08-13 – 2021-08-15 (×4): 100 mg
  Filled 2021-08-12 (×4): qty 10

## 2021-08-12 MED ORDER — HEPARIN SODIUM (PORCINE) 5000 UNIT/ML IJ SOLN: 5000.0000 [IU] | Freq: Three times a day (TID) | INTRAMUSCULAR | Status: DC

## 2021-08-12 MED ORDER — DEXTROSE 10 % IV SOLN: INTRAVENOUS | Status: DC

## 2021-08-12 MED ORDER — LACTATED RINGERS IV SOLN: INTRAVENOUS | Status: DC

## 2021-08-12 MED ORDER — FENTANYL CITRATE PF 50 MCG/ML IJ SOSY: 50.0000 ug | PREFILLED_SYRINGE | INTRAMUSCULAR | Status: DC | PRN

## 2021-08-12 MED ORDER — POTASSIUM CHLORIDE 20 MEQ PO PACK: 40.0000 meq | PACK | Freq: Every day | ORAL | Status: DC

## 2021-08-12 MED ORDER — SODIUM CHLORIDE 0.9 % IV SOLN
1.0000 g | INTRAVENOUS | Status: DC
  Administered 2021-08-13: 1 g via INTRAVENOUS
  Filled 2021-08-12: qty 1

## 2021-08-12 MED ORDER — NOREPINEPHRINE 4 MG/250ML-% IV SOLN
0.0000 ug/min | INTRAVENOUS | Status: DC
  Administered 2021-08-12 (×2): 20 ug/min via INTRAVENOUS
  Administered 2021-08-12: 19:00:00 25 ug/min via INTRAVENOUS
  Administered 2021-08-12: 19:00:00 23 ug/min via INTRAVENOUS
  Filled 2021-08-12 (×2): qty 250

## 2021-08-12 MED ORDER — DEXMEDETOMIDINE HCL IN NACL 400 MCG/100ML IV SOLN
0.4000 ug/kg/h | INTRAVENOUS | Status: DC
  Administered 2021-08-12: 17:00:00 0.4 ug/kg/h via INTRAVENOUS
  Administered 2021-08-13 (×2): 1.2 ug/kg/h via INTRAVENOUS
  Filled 2021-08-12 (×4): qty 100

## 2021-08-12 MED ORDER — POLYETHYLENE GLYCOL 3350 17 G PO PACK: 17.0000 g | PACK | Freq: Every day | ORAL | Status: DC | PRN

## 2021-08-12 MED ORDER — POTASSIUM CHLORIDE 20 MEQ PO PACK
40.0000 meq | PACK | Freq: Two times a day (BID) | ORAL | Status: DC
  Filled 2021-08-12: qty 2

## 2021-08-12 MED ORDER — FENTANYL CITRATE PF 50 MCG/ML IJ SOSY
PREFILLED_SYRINGE | INTRAMUSCULAR | Status: AC
  Filled 2021-08-12: qty 1

## 2021-08-12 MED ORDER — HYDROCORTISONE SOD SUC (PF) 100 MG IJ SOLR
100.0000 mg | Freq: Once | INTRAMUSCULAR | Status: AC
  Administered 2021-08-12: 12:00:00 100 mg via INTRAVENOUS
  Filled 2021-08-12: qty 2

## 2021-08-12 MED ORDER — DOCUSATE SODIUM 100 MG PO CAPS: 100.0000 mg | ORAL_CAPSULE | Freq: Two times a day (BID) | ORAL | Status: DC | PRN

## 2021-08-12 MED ORDER — FENTANYL CITRATE (PF) 100 MCG/2ML IJ SOLN
INTRAMUSCULAR | Status: DC | PRN
  Administered 2021-08-12: 50 ug via INTRAVENOUS

## 2021-08-12 MED ORDER — POLYETHYLENE GLYCOL 3350 17 G PO PACK
17.0000 g | PACK | Freq: Every day | ORAL | Status: DC
  Administered 2021-08-14 – 2021-08-15 (×2): 17 g
  Filled 2021-08-12 (×2): qty 1

## 2021-08-12 MED ORDER — NOREPINEPHRINE BITARTRATE 1 MG/ML IV SOLN: INTRAVENOUS | Status: DC | PRN

## 2021-08-12 MED ORDER — MIDAZOLAM HCL 2 MG/2ML IJ SOLN
2.0000 mg | INTRAMUSCULAR | Status: AC | PRN
  Administered 2021-08-12 (×3): 2 mg via INTRAVENOUS
  Filled 2021-08-12 (×3): qty 2

## 2021-08-12 MED ORDER — DOCUSATE SODIUM 50 MG/5ML PO LIQD: 100.0000 mg | Freq: Every day | ORAL | Status: DC | PRN

## 2021-08-12 MED ORDER — ORAL CARE MOUTH RINSE
15.0000 mL | OROMUCOSAL | Status: DC
  Administered 2021-08-12 – 2021-08-17 (×43): 15 mL via OROMUCOSAL

## 2021-08-12 MED ORDER — IOHEXOL 350 MG/ML SOLN
150.0000 mL | Freq: Once | INTRAVENOUS | Status: AC | PRN
  Administered 2021-08-12: 10:00:00 150 mL via INTRAVENOUS

## 2021-08-12 MED ORDER — PANTOPRAZOLE SODIUM 40 MG IV SOLR
40.0000 mg | Freq: Every day | INTRAVENOUS | Status: DC
  Administered 2021-08-12: 22:00:00 40 mg via INTRAVENOUS
  Filled 2021-08-12: qty 40

## 2021-08-12 MED ORDER — VASOPRESSIN 20 UNITS/100 ML INFUSION FOR SHOCK
0.0000 [IU]/min | INTRAVENOUS | Status: DC
  Administered 2021-08-12 – 2021-08-16 (×13): 0.03 [IU]/min via INTRAVENOUS
  Filled 2021-08-12 (×11): qty 100

## 2021-08-12 MED ORDER — NOREPINEPHRINE 16 MG/250ML-% IV SOLN
0.0000 ug/min | INTRAVENOUS | Status: DC
  Administered 2021-08-12 – 2021-08-13 (×2): 25 ug/min via INTRAVENOUS
  Filled 2021-08-12 (×2): qty 250

## 2021-08-12 MED ORDER — FENTANYL CITRATE PF 50 MCG/ML IJ SOSY
50.0000 ug | PREFILLED_SYRINGE | INTRAMUSCULAR | Status: DC | PRN
  Administered 2021-08-12: 13:00:00 100 ug via INTRAVENOUS
  Filled 2021-08-12: qty 2

## 2021-08-12 MED ORDER — ROCURONIUM BROMIDE 50 MG/5ML IV SOLN
INTRAVENOUS | Status: DC | PRN
  Administered 2021-08-12: 80 mg via INTRAVENOUS

## 2021-08-12 MED ORDER — PHENYLEPHRINE HCL (PRESSORS) 10 MG/ML IV SOLN
INTRAVENOUS | Status: DC | PRN
  Administered 2021-08-12: 160 mg
  Administered 2021-08-12: 200 mg
  Administered 2021-08-12: 8 mg

## 2021-08-12 MED ORDER — CHLORHEXIDINE GLUCONATE CLOTH 2 % EX PADS: 6.0000 | MEDICATED_PAD | Freq: Every day | CUTANEOUS | Status: DC

## 2021-08-12 MED ORDER — CALCIUM GLUCONATE-NACL 1-0.675 GM/50ML-% IV SOLN
1.0000 g | Freq: Once | INTRAVENOUS | Status: AC
  Administered 2021-08-12: 12:00:00 1000 mg via INTRAVENOUS
  Filled 2021-08-12: qty 50

## 2021-08-12 MED ORDER — MIDAZOLAM HCL 2 MG/2ML IJ SOLN
2.0000 mg | INTRAMUSCULAR | Status: DC | PRN
  Administered 2021-08-12 – 2021-08-13 (×3): 2 mg via INTRAVENOUS
  Filled 2021-08-12 (×3): qty 2

## 2021-08-12 MED ORDER — HYDROCORTISONE SOD SUC (PF) 100 MG IJ SOLR
75.0000 mg | Freq: Two times a day (BID) | INTRAMUSCULAR | Status: DC
  Administered 2021-08-12: 22:00:00 75 mg via INTRAVENOUS
  Filled 2021-08-12: qty 2

## 2021-08-12 MED ORDER — FENTANYL CITRATE (PF) 100 MCG/2ML IJ SOLN
50.0000 ug | INTRAMUSCULAR | Status: AC | PRN
  Administered 2021-08-12 – 2021-08-13 (×3): 50 ug via INTRAVENOUS
  Filled 2021-08-12 (×3): qty 2

## 2021-08-12 MED ORDER — VANCOMYCIN VARIABLE DOSE PER UNSTABLE RENAL FUNCTION (PHARMACIST DOSING): Status: DC

## 2021-08-12 MED ORDER — POTASSIUM CHLORIDE 20 MEQ PO PACK
40.0000 meq | PACK | Freq: Once | ORAL | Status: AC
  Administered 2021-08-12: 13:00:00 40 meq
  Filled 2021-08-12: qty 2

## 2021-08-12 MED ORDER — SODIUM CHLORIDE 0.9 % IV SOLN
INTRAVENOUS | Status: DC | PRN
  Administered 2021-08-12: 1000 mL via INTRAVENOUS

## 2021-08-12 MED ORDER — SODIUM CHLORIDE 0.9 % IV SOLN
2.0000 g | Freq: Once | INTRAVENOUS | Status: AC
  Administered 2021-08-12: 10:00:00 2 g via INTRAVENOUS

## 2021-08-12 MED ORDER — VANCOMYCIN HCL IN DEXTROSE 1-5 GM/200ML-% IV SOLN
1000.0000 mg | Freq: Once | INTRAVENOUS | Status: AC
  Administered 2021-08-12: 10:00:00 1000 mg via INTRAVENOUS
  Filled 2021-08-12: qty 200

## 2021-08-12 MED ORDER — FENTANYL CITRATE PF 50 MCG/ML IJ SOSY
50.0000 ug | PREFILLED_SYRINGE | INTRAMUSCULAR | Status: DC | PRN
  Administered 2021-08-12: 13:00:00 50 ug via INTRAVENOUS
  Filled 2021-08-12: qty 1

## 2021-08-12 MED ORDER — CHLORHEXIDINE GLUCONATE 0.12% ORAL RINSE (MEDLINE KIT)
15.0000 mL | Freq: Two times a day (BID) | OROMUCOSAL | Status: DC
  Administered 2021-08-12 – 2021-08-16 (×9): 15 mL via OROMUCOSAL

## 2021-08-12 MED ORDER — EPINEPHRINE HCL 5 MG/250ML IV SOLN IN NS: 0.5000 ug/min | INTRAVENOUS | Status: DC

## 2021-08-12 NOTE — Progress Notes (Signed)
[facility] Progress Note Patient Name: Adriana Tucker DOB: [birth year] MRN: [id number]   Date of Service  08/03/2021  HPI/Events of Note  ISTAT pregnancy test weakly positive, awaiting laboratory pregnancy test for confirmation.  eICU Interventions  Discontinue MRI pending confirmatory pregnancy test.        Adriana Tucker Adriana Tucker 07/29/2021, 10:29 PM

## 2021-08-12 NOTE — Consult Note (Signed)
Neurology Consultation Reason for Consult: unresponsiveness Referring Physician: Jeanella Craze  CC: Unresponsiveness  History is obtained from: Family  HPI: Adriana Tucker is a 40 y.o. female with a history of left-sided weakness due to previous brain aneurysm, CKD due to lupus, hypertension who presents with unresponsiveness.  Her family states that she was acting normally last night, but when they went to wake her this morning she was unresponsive.  On EMS arrival, she was quite hypotensive.  Due to unresponsiveness, she was intubated.   LKW: Last night prior to bed   ROS:  Unable to obtain due to altered mental status.   Past Medical History:  Diagnosis Date   Brain aneurysm    CKD (chronic kidney disease)    due to lupus   Hypertension    Lupus ([place])      Family history: Unable to obtain due to altered mental status.   Social History:  reports that she has quit smoking. She has never used smokeless tobacco. She reports that she does not drink alcohol and does not use drugs.   Exam: Current vital signs: BP 92/65   Pulse (!) 120   Temp 98.6 F (37 C) (Oral)   Resp (!) 28   Ht 5\' 1"  (1.549 m)   Wt 60.5 kg   SpO2 93%   BMI 25.20 kg/m  Vital signs in last 24 hours: Temp:  [97.5 F (36.4 C)-98.6 F (37 C)] 98.6 F (37 C) (11/17 1527) Pulse Rate:  [48-123] 120 (11/17 1527) Resp:  [0-35] 28 (11/17 1527) BP: (55-159)/(37-86) 92/65 (11/17 1527) SpO2:  [28 %-100 %] 93 % (11/17 1527) Arterial Line BP: (93)/(84) 93/84 (11/17 1527) FiO2 (%):  [40 %-100 %] 40 % (11/17 1527) Weight:  [60.5 kg] 60.5 kg (11/17 1527)   Physical Exam  Constitutional: Appears well-developed and well-nourished.  Psych: Does not respond Eyes: No scleral injection HENT: ET tube in place MSK: no joint deformities.  Cardiovascular: Normal rate and regular rhythm.  Respiratory: Effort normal, non-labored breathing GI: Soft.  No distension. There is no tenderness.  Skin: WDI  Neuro: Mental  Status: Patient is comatose, she does not open eyes or follow commands Cranial Nerves: II: She does not blink to threat, right pupil is 3 mm-> 47mm left pupil is 2 mm->1.67mm III,IV, VI: No response to doll's eye maneuver V:VII: Corneals are intact bilaterally X: No cough to suction Motor: No response to noxious stimulation  sensory: Does not respond  Cerebellar: Does not perform   I have reviewed labs in epic and the results pertinent to this consultation are: Lactate 2.2 TSH 2.4 Procalcitonin 37 Ammonia 17  I have reviewed the images obtained: CT head-unremarkable  Impression: 40 year old female with unresponsiveness in the setting of hypotension.  Etiology is currently unclear, but certainly prolonged hypotension alone could cause cerebral injury and mental status change.  We will need to rule out other causes such as other causes of cerebral ischemia, seizure.  Recommendations: 1) stat EEG 2) MRI brain 3) monitoring response to supportive care. 4) neurology will continue to follow  This patient is critically ill and at significant risk of neurological worsening, death and care requires constant monitoring of vital signs, hemodynamics,respiratory and cardiac monitoring, neurological assessment, discussion with family, other specialists and medical decision making of high complexity. I spent 35 minutes of neurocritical care time  in the care of  this patient. This was time spent independent of any time provided by nurse practitioner or PA.  [name]  Leonel Ramsay, MD [facility] [phone]  If 7pm- 7am, please page neurology on call as listed in [place]. 08/01/2021  4:03 PM

## 2021-08-12 NOTE — Progress Notes (Signed)
Patient was transported to CT & back to ER room 16 on the ventilator with no problems.

## 2021-08-12 NOTE — ED Provider Notes (Signed)
[facility] EMERGENCY DEPARTMENT Provider Note   CSN: [id number] Arrival date & time: 08/18/2021  3545     History Chief Complaint  Patient presents with   Altered Mental Status    Adriana Tucker is a 40 y.o. female with PMH HTN, CKD and ESRD on hemodialysis Tuesday Thursday Saturday secondary to lupus nephritis, previous brain aneurysm with persistent left-sided deficits who presents the emergency department for evaluation of altered mental status.  Last known well at 0100 this morning.  History obtained from EMS stating that they found the patient with significant hypotension in the low 60s, unresponsive.  Additional review of systems unable be obtained as the patient is unresponsive.  On arrival, patient is tachycardic, hypotensive and not responding to commands or painful stimuli.   Altered Mental Status     Past Medical History:  Diagnosis Date   Brain aneurysm    CKD (chronic kidney disease)    due to lupus   Hypertension    Lupus ([place])     Patient Active Problem List   Diagnosis Date Noted   Acute encephalopathy 08/10/2021   Shock ([place])    ESRD (end stage renal disease) ([place])    Acute respiratory failure with hypoxia (HCC)    Cephalalgia    Exacerbation of systemic lupus (HCC)    Urinary retention    Lupus nephritis ([place])    Cerebral aneurysm rupture (HCC)    Intractable migraine without aura and with status migrainosus    Acute on chronic renal failure (HCC)    Diabetes type 2, controlled (HCC)    Migraine without aura and without status migrainosus, not intractable    Migraine without aura and with status migrainosus, not intractable    Chronic kidney disease    Diabetes type 2, uncontrolled    HLD (hyperlipidemia)    Hypertensive urgency 12/29/2014   History of cerebral aneurysm repair 12/29/2014    Past Surgical History:  Procedure Laterality Date   BACK SURGERY     BRAIN SURGERY     IR REPAIR TUN/NON TUN CV CATH W/O FL   07/05/2017     OB History   No obstetric history on file.     No family history on file.  Social History   Tobacco Use   Smoking status: Former   Smokeless tobacco: Never  Substance Use Topics   Alcohol use: No   Drug use: No    Home Medications Prior to Admission medications   Medication Sig Start Date End Date Taking? Authorizing Provider  ammonium lactate (AMLACTIN) 12 % cream Apply 1 application topically daily as needed for rash or dry skin. 03/17/21  Yes [provider]  buPROPion ER (WELLBUTRIN SR) 100 MG 12 hr tablet Take 100 mg by mouth every other day. 07/19/21  Yes [provider]  dexamethasone (DECADRON) 2 MG tablet Take 4 mg by mouth every other day. 07/19/21  Yes [provider]  fluconazole (DIFLUCAN) 100 MG tablet Take 100 mg by mouth 3 (three) times a week. After dialysis 07/16/21  Yes [provider]  gabapentin (NEURONTIN) 100 MG capsule Take 1 capsule (100 mg total) by mouth 3 (three) times daily. Patient taking differently: Take 100 mg by mouth See admin instructions. 100mg  in the evening only on dialysis days 01/08/15  Yes Cherene Altes, MD  Multiple Vitamins-Minerals (MULTIVITAMIN WITH MINERALS) tablet Take 1 tablet by mouth daily.   Yes [provider]  oxyCODONE-acetaminophen (PERCOCET) 10-325 MG tablet Take 1  tablet by mouth every 8 (eight) hours as needed for pain. 08/05/21  Yes [provider]  atorvastatin (LIPITOR) 40 MG tablet Take 1 tablet (40 mg total) by mouth every evening. Patient not taking: Reported on 07/29/2021 01/08/15   Cherene Altes, MD  cloNIDine (CATAPRES) 0.2 MG tablet Take 2 tablets (0.4 mg total) by mouth 3 (three) times daily. Patient not taking: Reported on 08/11/2021 01/08/15   Cherene Altes, MD  furosemide (LASIX) 80 MG tablet Take 1 tablet (80 mg total) by mouth daily. Patient not taking: Reported on 08/10/2021 01/08/15   Cherene Altes, MD  glipiZIDE  (GLUCOTROL) 5 MG tablet Take 0.5 tablets (2.5 mg total) by mouth daily before breakfast. Patient not taking: Reported on 08/10/2021 01/08/15   Cherene Altes, MD  hydrALAZINE (APRESOLINE) 100 MG tablet Take 1 tablet (100 mg total) by mouth every 8 (eight) hours. Patient not taking: Reported on 08/14/2021 01/08/15   Cherene Altes, MD  metoprolol succinate (TOPROL-XL) 100 MG 24 hr tablet Take 1 tablet (100 mg total) by mouth 2 (two) times daily. Take with or immediately following a meal. Patient not taking: Reported on 08/24/2021 01/05/15   Cherene Altes, MD    Allergies    Patient has no known allergies.  Review of Systems   Review of Systems  Unable to perform ROS: Patient unresponsive   Physical Exam Updated Vital Signs BP (!) 159/86 (BP Location: Right Arm)   Pulse (!) 117   Temp (!) 97.5 F (36.4 C) (Oral)   Resp 14   Ht 5\' 1"  (1.549 m)   SpO2 98%   BMI 30.23 kg/m   Physical Exam Vitals and nursing note reviewed.  Constitutional:      General: She is not in acute distress.    Appearance: She is well-developed. She is ill-appearing and toxic-appearing.  HENT:     Head: Normocephalic and atraumatic.  Eyes:     Comments: Ophthalmoplegia  Cardiovascular:     Rate and Rhythm: Regular rhythm. Tachycardia present.     Heart sounds: No murmur heard. Pulmonary:     Effort: No respiratory distress.     Comments: tachypneic Abdominal:     Palpations: Abdomen is soft.     Tenderness: There is no abdominal tenderness.  Musculoskeletal:        General: No swelling.     Cervical back: Neck supple.     Right lower leg: Edema present.     Left lower leg: Edema present.  Skin:    General: Skin is warm and dry.     Capillary Refill: Capillary refill takes less than 2 seconds.  Neurological:     Comments: Unresponsive, contracted left upper extremity  Psychiatric:        Mood and Affect: Mood normal.    ED Results / Procedures / Treatments   Labs (all labs  ordered are listed, but only abnormal results are displayed) Labs Reviewed  CBC WITH DIFFERENTIAL/PLATELET - Abnormal; Notable for the following components:      Result Value   WBC 2.0 (*)    RBC 2.73 (*)    Hemoglobin 7.7 (*)    HCT 25.3 (*)    Platelets 50 (*)    Neutro Abs 1.4 (*)    Lymphs Abs 0.5 (*)    All other components within normal limits  COMPREHENSIVE METABOLIC PANEL - Abnormal; Notable for the following components:   Potassium 2.9 (*)    BUN 37 (*)  Creatinine, Ser 6.37 (*)    Calcium 7.4 (*)    Total Protein 4.0 (*)    Albumin 2.0 (*)    Alkaline Phosphatase 33 (*)    GFR, Estimated 8 (*)    All other components within normal limits  LACTIC ACID, PLASMA - Abnormal; Notable for the following components:   Lactic Acid, Venous 2.7 (*)    All other components within normal limits  I-STAT CHEM 8, ED - Abnormal; Notable for the following components:   Potassium 2.8 (*)    BUN 32 (*)    Creatinine, Ser 6.80 (*)    Calcium, Ion 0.94 (*)    Hemoglobin 7.5 (*)    HCT 22.0 (*)    All other components within normal limits  I-STAT BETA HCG BLOOD, ED ([facility], [facility], [facility] ONLY) - Abnormal; Notable for the following components:   I-stat hCG, quantitative 7.4 (*)    All other components within normal limits  I-STAT ARTERIAL BLOOD GAS, ED - Abnormal; Notable for the following components:   pO2, Arterial 76 (*)    Potassium 2.6 (*)    Calcium, Ion 1.01 (*)    HCT 24.0 (*)    Hemoglobin 8.2 (*)    All other components within normal limits  TROPONIN I (HIGH SENSITIVITY) - Abnormal; Notable for the following components:   Troponin I (High Sensitivity) 49 (*)    All other components within normal limits  RESP PANEL BY RT-PCR (FLU A&B, COVID) ARPGX2  CULTURE, BLOOD (ROUTINE X 2)  CULTURE, BLOOD (ROUTINE X 2)  CULTURE, RESPIRATORY W GRAM STAIN  AMMONIA  PROCALCITONIN  URINALYSIS, ROUTINE W REFLEX MICROSCOPIC  HIV ANTIBODY (ROUTINE TESTING W REFLEX)  BLOOD GAS, ARTERIAL  TSH   LACTIC ACID, PLASMA  CORTISOL  CBG MONITORING, ED  TROPONIN I (HIGH SENSITIVITY)    EKG None  Radiology CT ANGIO HEAD NECK W WO CM  Result Date: 08/24/2021 CLINICAL DATA:  Unresponsive.  History of aneurysm. EXAM: CT ANGIOGRAPHY HEAD AND NECK TECHNIQUE: Multidetector CT imaging of the head and neck was performed using the standard protocol during bolus administration of intravenous contrast. Multiplanar CT image reconstructions and MIPs were obtained to evaluate the vascular anatomy. Carotid stenosis measurements (when applicable) are obtained utilizing NASCET criteria, using the distal internal carotid diameter as the denominator. CONTRAST:  141mL OMNIPAQUE IOHEXOL 350 MG/ML SOLN COMPARISON:  MRI head and MRA head and neck 12/30/2014. CT head 08/03/2021 FINDINGS: CTA NECK FINDINGS Aortic arch: Proximal great vessels widely patent. Bovine branching arch. Right carotid system: Right carotid widely patent. Negative for stenosis or atherosclerotic disease. Left carotid system: Left carotid bifurcation widely patent. There is small caliber of the left internal carotid artery throughout the neck and skull base without focal stenosis or dissection. Review of the prior MRA neck without contrast 12/30/2014 reveals progression of small caliber left internal carotid artery. This may be due to decreased outflow with hypoplastic left A1 segment. Vertebral arteries: Both vertebral arteries patent to the basilar. Skeleton: Bilateral laminectomy C3 through C7. Surgical clip in the spinal canal on the right at C5-6. This may be a vascular clip. No vascular malformation is identified in this area. Other neck: Patient is intubated. No mass in the neck. Right jugular central venous catheter tip. Upper chest: Mild atelectasis left upper lobe. Right upper lobe clear. Review of the MIP images confirms the above findings CTA HEAD FINDINGS Anterior circulation: Cavernous carotid patent bilaterally. Left cavernous carotid  smaller than the right. Hypoplastic but patent  left A1 segment. Right A1 segment normal in size. Both anterior cerebral arteries patent. Middle cerebral arteries are patent bilaterally without stenosis or aneurysm. Posterior circulation: Both vertebral arteries patent to the basilar. PICA patent bilaterally. Basilar widely patent. Superior cerebellar and posterior cerebral arteries patent bilaterally without stenosis. No aneurysm in the posterior circulation. Venous sinuses: Normal venous enhancement Anatomic variants: None Review of the MIP images confirms the above findings IMPRESSION: 1. No intracranial aneurysm. No intracranial stenosis or large vessel occlusion 2. Normal right carotid artery 3. Diffusely small caliber left internal carotid artery without focal stenosis. This may be due to a decreased outflow with hypoplastic left A1 segment. 4. Cervical laminectomy C3 through C7. Surgical vascular clips in the spinal canal on the right at C5-6. No vascular malformation is present in the cervical spine. Electronically Signed   By: Franchot Gallo M.D.   On: 07/29/2021 10:15   CT Head Wo Contrast  Result Date: 08/25/2021 CLINICAL DATA:  Family had trouble waking the patient up this morning, altered mental status EXAM: CT HEAD WITHOUT CONTRAST TECHNIQUE: Contiguous axial images were obtained from the base of the skull through the vertex without intravenous contrast. COMPARISON:  None. FINDINGS: Brain: There is no evidence of acute intracranial hemorrhage, extra-axial fluid collection, or acute infarct. There are remote infarcts in the bilateral cerebellar hemispheres, left larger than right. There is mild global parenchymal volume loss, greater than expected for age. The ventricles are not enlarged. There is no solid mass lesion. There is no midline shift. An enlarged empty sella is noted, likely incidental. Vascular: No hyperdense vessel or unexpected calcification. Skull: Normal. Negative for fracture or  focal lesion. Sinuses/Orbits: There are secretions in the imaged nasopharynx. The imaged paranasal sinuses are clear. The globes and orbits are unremarkable. Other: There is advanced degenerative change of the temporomandibular joints. IMPRESSION: 1. No acute intracranial pathology. 2. 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 3. Mild global parenchymal volume loss, slightly greater than expected for age. Electronically Signed   By: Valetta Mole M.D.   On: 08/06/2021 09:23   EEG adult  Result Date: 08/01/2021 Lora Havens, MD     07/27/2021 12:31 PM Patient Name: Adriana Tucker MRN: [id number] Epilepsy Attending: Lora Havens Referring Physician/Provider: Dr. Kathrynn Speed Date: 08/21/2021 Duration: 22.09 mins Patient history: 40 year old female with altered mental status.  EEG to evaluate for seizure. Level of alertness: Awake AEDs during EEG study: Gabapentin Technical aspects: This EEG study was done with scalp electrodes positioned according to the 10-20 International system of electrode placement. Electrical activity was acquired at a sampling rate of 500Hz  and reviewed with a high frequency filter of 70Hz  and a low frequency filter of 1Hz . EEG data were recorded continuously and digitally stored. Description: The posterior dominant rhythm consists of 8 Hz activity of moderate voltage (25-35 uV) seen predominantly in posterior head regions, symmetric and reactive to eye opening and eye closing. Hyperventilation and photic stimulation were not performed.   IMPRESSION: This study is within normal limits. No seizures or epileptiform discharges were seen throughout the recording. Lora Havens   CT Angio Chest/Abd/Pel for Dissection W and/or Wo Contrast  Result Date: 08/14/2021 CLINICAL DATA:  Chest pain, aortic dissection suspected, difficulty arousing this morning EXAM: CT ANGIOGRAPHY CHEST, ABDOMEN AND PELVIS TECHNIQUE: Non-contrast CT of the  chest was initially obtained. Multidetector CT imaging through the chest, abdomen and pelvis was performed using the standard protocol during bolus administration of  intravenous contrast. Multiplanar reconstructed images and MIPs were obtained and reviewed to evaluate the vascular anatomy. CONTRAST:  156mL OMNIPAQUE IOHEXOL 350 MG/ML SOLN COMPARISON:  None. FINDINGS: CTA CHEST FINDINGS Cardiovascular: Preferential opacification of the thoracic aorta. Normal contour and caliber of the thoracic aorta. No evidence of aneurysm, dissection, or other acute aortic pathology. Normal heart size. No pericardial effusion. Large-bore right neck vascular catheter. Mediastinum/Nodes: No enlarged mediastinal, hilar, or axillary lymph nodes. Thyroid gland, trachea, and esophagus demonstrate no significant findings. Lungs/Pleura: Endotracheal intubation, endotracheal tube tip just above the ostium of the right mainstem bronchus (series 11, image 54). Very extensive bilateral heterogeneous and consolidative airspace opacity throughout the dependent lungs. Trace bilateral pleural effusions. Musculoskeletal: No chest wall abnormality. No acute or significant osseous findings. Review of the MIP images confirms the above findings. CTA ABDOMEN AND PELVIS FINDINGS VASCULAR Normal contour and caliber of the abdominal aorta. No evidence of aneurysm, dissection, or other acute aortic pathology. Standard branching pattern of the aorta with solitary bilateral renal arteries. No significant atherosclerosis. Infrarenal IVC filter. Review of the MIP images confirms the above findings. NON-VASCULAR Hepatobiliary: No solid liver abnormality is seen. Gallstones. No gallbladder wall thickening, or biliary dilatation. Pancreas: Unremarkable. No pancreatic ductal dilatation or surrounding inflammatory changes. Spleen: Normal in size without significant abnormality. Adrenals/Urinary Tract: Adrenal glands are unremarkable. Kidneys are normal, without  renal calculi, solid lesion, or hydronephrosis. Thickening and fat stranding of the urinary bladder wall (series 16, image 74). Stomach/Bowel: Stomach is within normal limits. Appendix appears normal. No evidence of bowel wall thickening, distention, or inflammatory changes. Lymphatic: No enlarged abdominal or pelvic lymph nodes. Reproductive: No mass or other significant abnormality. Other: No abdominal wall hernia. Anasarca. Small volume ascites throughout the abdomen and pelvis. Musculoskeletal: No acute or significant osseous findings. Renal osteodystrophy. Review of the MIP images confirms the above findings. IMPRESSION: 1. Normal contour and caliber of the thoracic and abdominal aorta. No evidence of aneurysm, dissection, or other acute aortic pathology. No significant atherosclerosis. 2. Endotracheal intubation, endotracheal tube tip just above the of the right mainstem bronchus. Recommend slight retraction. 3. Very extensive bilateral heterogeneous and consolidative airspace opacity throughout the dependent lungs, most consistent with aspiration. 4. Thickening and fat stranding of the urinary bladder wall, suggestive of cystitis. Correlate with urinalysis. 5. Small volume ascites and anasarca. 6. Cholelithiasis. Electronically Signed   By: Delanna Ahmadi M.D.   On: 08/10/2021 10:09    Procedures .Critical Care Performed by: Teressa Lower, MD Authorized by: Teressa Lower, MD   Critical care provider statement:    Critical care time (minutes):  90   Critical care was necessary to treat or prevent imminent or life-threatening deterioration of the following conditions:  Cardiac failure, respiratory failure, CNS failure or compromise and dehydration   Critical care was time spent personally by me on the following activities:  Development of treatment plan with patient or surrogate, discussions with consultants, evaluation of patient's response to treatment, examination of patient, ordering and review  of laboratory studies, ordering and review of radiographic studies, ordering and performing treatments and interventions, pulse oximetry, re-evaluation of patient's condition and review of old charts   Care discussed with: admitting provider   Procedure Name: Intubation Date/Time: 08/14/2021 12:42 PM Performed by: Teressa Lower, MD Pre-anesthesia Checklist: Patient identified, Patient being monitored, Emergency Drugs available, Timeout performed and Suction available Oxygen Delivery Method: Non-rebreather mask Preoxygenation: Pre-oxygenation with 100% oxygen Induction Type: Rapid sequence Ventilation: Mask ventilation without difficulty Laryngoscope Size: Glidescope and  3 Tube size: 7.5 mm Number of attempts: 1 Airway Equipment and Method: Video-laryngoscopy Placement Confirmation: ETT inserted through vocal cords under direct vision, CO2 detector and Breath sounds checked- equal and bilateral Secured at: 25 cm    .Central Line  Date/Time: 07/29/2021 12:43 PM Performed by: Teressa Lower, MD Authorized by: Teressa Lower, MD   Consent:    Consent obtained:  Emergent situation Pre-procedure details:    Indication(s): central venous access     Skin preparation:  Chlorhexidine Sedation:    Sedation type:  None Anesthesia:    Anesthesia method:  None Procedure details:    Location:  R femoral   Ultrasound guidance: yes     Ultrasound guidance timing: real time     Number of attempts:  1   Successful placement: yes   Post-procedure details:    Post-procedure:  Line sutured and dressing applied   Assessment:  Blood return through all ports   Procedure completion:  Tolerated ARTERIAL LINE  Date/Time: 08/01/2021 12:44 PM Performed by: Teressa Lower, MD Authorized by: Teressa Lower, MD   Consent:    Consent obtained:  Emergent situation Indications:    Indications: hemodynamic monitoring   Pre-procedure details:    Skin preparation:  Chlorhexidine Sedation:     Sedation type:  None Anesthesia:    Anesthesia method:  None Procedure details:    Location:  R femoral   Number of attempts:  1   Transducer: waveform confirmed   Post-procedure details:    Post-procedure:  Biopatch applied, secured with tape, sterile dressing applied and sutured   Procedure completion:  Tolerated well, no immediate complications IO LINE INSERTION  Date/Time: 08/20/2021 12:48 PM Performed by: Teressa Lower, MD Authorized by: Teressa Lower, MD   Consent:    Consent obtained:  Emergent situation Procedure details:    Insertion site:  R proximal tibia   Insertion device:  Drill device   Insertion: Needle was inserted through the bony cortex     Number of attempts:  1   Insertion confirmation:  Aspiration of blood/marrow Post-procedure details:    Procedure completion:  Tolerated IO LINE INSERTION  Date/Time: 08/16/2021 12:48 PM Performed by: Teressa Lower, MD Authorized by: Teressa Lower, MD   Consent:    Consent obtained:  Emergent situation Procedure details:    Insertion site:  L proximal tibia   Number of attempts:  1   Insertion confirmation:  Aspiration of blood/marrow Post-procedure details:    Procedure completion:  Tolerated   Medications Ordered in ED Medications  norepinephrine (LEVOPHED) injection (0 mcg/kg/min  72.6 kg (Order-Specific) Intravenous Stopped 08/09/2021 1146)  0.9 %  sodium chloride infusion (0 mLs Intravenous Stopped 08/10/2021 1146)  rocuronium (ZEMURON) injection (80 mg Intravenous Given 08/22/2021 0853)  fentaNYL (SUBLIMAZE) 50 MCG/ML injection (  Not Given 08/23/2021 1115)  vasopressin (PITRESSIN) 20 Units in sodium chloride 0.9 % 100 mL infusion-*FOR SHOCK* (0.03 Units/min Intravenous New Bag/Given 08/14/2021 0902)  norepinephrine (LEVOPHED) 4mg  in 270mL premix infusion (20 mcg/min Intravenous New Bag/Given (Non-Interop) 08/08/2021 1207)  heparin injection 5,000 Units (has no administration in time range)  pantoprazole  (PROTONIX) injection 40 mg (has no administration in time range)  lactated ringers infusion ( Intravenous New Bag/Given 08/08/2021 1213)  polyethylene glycol (MIRALAX / GLYCOLAX) packet 17 g (has no administration in time range)  docusate (COLACE) 50 MG/5ML liquid 100 mg (100 mg Per Tube Not Given 08/16/2021 1042)  polyethylene glycol (MIRALAX / GLYCOLAX) packet 17 g (17 g Per Tube Not Given  08/23/2021 1043)  midazolam (VERSED) injection 2 mg (has no administration in time range)  midazolam (VERSED) injection 2 mg (has no administration in time range)  fentaNYL (SUBLIMAZE) injection 50 mcg (has no administration in time range)  fentaNYL (SUBLIMAZE) injection 50-200 mcg (has no administration in time range)  calcium gluconate 1 g/ 50 mL sodium chloride IVPB (1,000 mg Intravenous New Bag/Given 08/25/2021 1221)  hydrocortisone sodium succinate (SOLU-CORTEF) 100 MG injection 100 mg (100 mg Intravenous Given 08/02/2021 1210)    Followed by  hydrocortisone sodium succinate (SOLU-CORTEF) 100 MG injection 75 mg (has no administration in time range)  ceFEPIme (MAXIPIME) 1 g in sodium chloride 0.9 % 100 mL IVPB (has no administration in time range)  vancomycin variable dose per unstable renal function (pharmacist dosing) (has no administration in time range)  docusate (COLACE) 50 MG/5ML liquid 100 mg (has no administration in time range)  potassium chloride (KLOR-CON) packet 40 mEq (has no administration in time range)  ceFEPIme (MAXIPIME) 2 g in sodium chloride 0.9 % 100 mL IVPB (0 g Intravenous Stopped 08/06/2021 1015)  vancomycin (VANCOCIN) IVPB 1000 mg/200 mL premix (0 mg Intravenous Stopped 08/01/2021 1124)  iohexol (OMNIPAQUE) 350 MG/ML injection 150 mL (150 mLs Intravenous Contrast Given 08/18/2021 0959)    ED Course  I have reviewed the triage vital signs and the nursing notes.  Pertinent labs & imaging results that were available during my care of the patient were reviewed by me and considered in my medical  decision making (see chart for details).    MDM Rules/Calculators/A&P                           Patient seen the emergency department for evaluation of hypotension and altered mental status.  Physical exam reveals a toxic appearing patient who is hypotensive and tachycardic with contracted left upper extremity consistent with her known left-sided deficits, bilateral lower extremity edema, ophthalmoplegia and tachypnea.  Bedside ultrasound with no evidence of cardiac tamponade with a collapsible IVC.  Peripheral access unable to be obtained and due to significant hypotension, bilateral IO's were placed and patient started on fluid resuscitation with multiple pushes of Neo-Synephrine.  Emergent central line placed in the right groin, A-line in the left groin.  Intubation performed after discussion with family about CODE STATUS and family confirming that patient's CODE STATUS is full.  Intubation performed without difficulty.  Patient requiring multiple pressors including Levophed and vasopressin.  Initial laboratory evaluation with a lactate of 2.7, leukopenia 2.0, hemoglobin 7.7, hypokalemia to 2.9, creatinine 6.37, troponin 49.  Initial i-STAT beta-hCG elevated to 7.4 but I have very low suspicion for intrauterine pregnancy at this time and will attempt to reconfirm with a urine sample.  Patient started on broad-spectrum antibiotics.  Due to the emergent nature of the patient's presentation, CT imaging was performed regardless.  Initial CT head with no head bleed or acute intracranial pathology.  CT angio of the head neck with no intracranial aneurysm or vessel occlusion.  CT dissection study performed which showed no evidence of dissection, extensive bilateral heterogenous airspace disease consistent with aspiration, thickening and fat stranding concerning for cystitis and anasarca.  CT also showing ET tube at the tip of the carina and this was retracted 2 cm.  Patient blood pressure normalized with Levophed  and vasopressin and the patient was admitted to the ICU.  Neurology was consulted for stat EEG. Final Clinical Impression(s) / ED Diagnoses Final diagnoses:  None  Rx / DC Orders ED Discharge Orders     None        Christasia Angeletti, Debe Coder, MD 08/11/2021 1253

## 2021-08-12 NOTE — Progress Notes (Signed)
Pt was transported from ED to 50M ICU Bed: 50M13C via ventilator with no complications. Pt is currently stable. RT will continue to monitor.

## 2021-08-12 NOTE — H&P (Signed)
NAME:  Adriana Tucker, MRN:  [id number], DOB:  [birth year], LOS: 0 ADMISSION DATE:  08/16/2021, CONSULTATION DATE:  08/02/2021 REFERRING MD:  Kommor CHIEF COMPLAINT:  AMS   History of Present Illness:  Pt is encephelopathic; therefore, this HPI is obtained from chart review. Adriana Tucker is a 40 y.o. 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  She presented to [facility] ED 11/17 with AMS.  Family apparently had trouble waking her up that morning; therefore, EMS was called.  On EMS arrival, she was minimally responsive to noxious stimuli. Per mother, pt had been complaining of chills and always feeling "cold to the bone".  Also developed a cough the night prior to ED presentation.  Otherwise, no complaints and she was in her usual state of health.  She has had about roughly 50-60lbs weight loss in the past 2-3 months.  She has seen nephrology, rheumatology, and PCP back in [facility] without any explanation / diagnosis.  In ED, CT head and CTA head/neck negative for acute process (did show remote known bilateral cerebellar infarcts). CTA chest/abd/pelv neg for aneurysm or dissection, did show probable aspiration PNA and small volume ascites along with cholelithiasis. She was intubated for airway protection and she was found to be profoundly hypotensive; therefore, was started on NE, epi, vaso.  CVL and art line were placed by EDP. PCCM called for admission to ICU. Neuro consulted for assistance.  Pertinent  Medical History:  has Hypertensive urgency; History of cerebral aneurysm repair; Migraine without aura and with status migrainosus, not intractable; Chronic kidney disease; Diabetes type 2, uncontrolled; HLD (hyperlipidemia); Acute on chronic renal failure ([place]); Diabetes type 2, controlled ([place]); Migraine without aura and without status migrainosus, not intractable; Lupus nephritis ([place]);  Cerebral aneurysm rupture ([place]); Intractable migraine without aura and with status migrainosus; Exacerbation of systemic lupus ([place]); Urinary retention; and Cephalalgia on their problem list.  Significant Hospital Events: Including procedures, antibiotic start and stop dates in addition to other pertinent events   11/17 > admit.  Interim History / Subjective:  On vent, multiple pressors with BP improving.  Objective:  Blood pressure (!) 86/50, pulse (!) 48, temperature (!) 97.5 F (36.4 C), temperature source Oral, resp. rate 12, SpO2 (!) 28 %.       No intake or output data in the 24 hours ending 08/16/2021 1000 There were no vitals filed for this visit.  Examination: General: Adult female, critically ill. Neuro: Non responsive. HEENT: /AT. Sclerae anicteric. ETT in place. Cardiovascular: RRR, no M/R/G.  Lungs: Respirations even and unlabored.  CTA bilaterally, No W/R/R. Abdomen: BS x 4, soft, NT/ND.  Musculoskeletal: No gross deformities, no edema.  Skin: Intact, warm, no rashes.  Labs/imaging personally reviewed:  CT head 11/17 >? No acute process, remote infarcts in the bilateral cerebellar hemispheres, mild global parenchymal volume loss. CTA head / neck 11/17 >  CTA chest/abd/pelv 11/17 > no aneurysm, dissection or acute vascular pathology.  Aspiration PNA, cystitis, small volume ascites, cholelithiasis.  Assessment & Plan:   Acute encephalopathy - unclear etiology thus far.  Ammonia neg, CT head / CTA head/neck neg.  Certainly could be explained by profound hypotension / shock.   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 - Supportive care. -  Check TSH. - Avoid sedating meds. - Assess EEG, MRI brain. - Neuro consulted, appreciate the assistance.  Respiratory insufficiency - due to inability to protect the airway in the setting of above. - Full vent  support. - Wean as able. - Daily SBT. - Bronchial hygiene. - Follow CXR.  Shock - unclear etiology thus far, several labs still pending. ? Relative AI - on chronic pred though mother not sure if she is still taking it or not. - Continue NE, epi, vaso for goal MAP > 65. - Wean epi first followed by vaso then NE. - Continue empiric Vanc, Cefepime. - Trend lactate. - Check cortisol. - Stress steroids. - Assess PCT.  ESRD on HD TTS (last session Tues 11/15) - 2/2 lupus nephritis. Hypokalemia. Hypocalcemia. - Will notify nephrology of admission. - 40 mEq K per tube. - 1g Ca gluconate. - BMP q12 hours.  Anemia of chronic disease. - Transfuse for Hgb < 7.  Mildly elevated hCG (7.4) - unclear what to make of this at this point.  Of note, mother does report 50-60lb weight loss over past 2-3 months (? Underlying malignancy if not pregnant). - Repeat in AM and if remains elevated or climbs, get abd / pelvic ultrasound.  Hx lupus. - Stress dose steroids. - Hold home Prednisone, Cellcept.  Hx DM. - Hold home Glipizide. - SSI if glucose consistently > 180.   Best practice (evaluated daily):  Diet/type: NPO DVT prophylaxis: prophylactic heparin  GI prophylaxis: PPI Lines: Central line and Arterial Line Foley:  N/A Code Status:  full code Last date of multidisciplinary goals of care discussion: None yet  Labs   CBC: Recent Labs  Lab 07/30/2021 0849  HGB 7.5*  HCT 22.0*    Basic Metabolic Panel: Recent Labs  Lab 08/16/2021 0849  NA 137  K 2.8*  CL 100  GLUCOSE 82  BUN 32*  CREATININE 6.80*   GFR: CrCl cannot be calculated (Unknown ideal weight.). No results for input(s): PROCALCITON, WBC, LATICACIDVEN in the last 168 hours.  Liver Function Tests: No results for input(s): AST, ALT, ALKPHOS, BILITOT, PROT, ALBUMIN in the last 168 hours. No results for input(s): LIPASE, AMYLASE in the last 168 hours. Recent Labs  Lab 07/29/2021 0830  AMMONIA 17    ABG     Component Value Date/Time   TCO2 22 08/11/2021 0849     Coagulation Profile: No results for input(s): INR, PROTIME in the last 168 hours.  Cardiac Enzymes: No results for input(s): CKTOTAL, CKMB, CKMBINDEX, TROPONINI in the last 168 hours.  HbA1C: Hgb A1c MFr Bld  Date/Time Value Ref Range Status  12/30/2014 09:58 AM 6.2 (H) 4.8 - 5.6 % Final    Comment:    (NOTE)         Pre-diabetes: 5.7 - 6.4         Diabetes: >6.4         Glycemic control for adults with diabetes: <7.0     CBG: Recent Labs  Lab 07/27/2021 0834  GLUCAP 83    Review of Systems:   Unable to obtain as pt is encephalopathic.  Past Medical History:  She,  has a past medical history of Brain aneurysm, CKD (chronic kidney disease), Hypertension, and Lupus ([place]).   Surgical History:   Past Surgical History:  Procedure Laterality Date   BACK SURGERY     BRAIN SURGERY     IR REPAIR TUN/NON TUN CV CATH W/O FL  07/05/2017     Social History:  reports that she has quit smoking. She has never used smokeless tobacco. She reports that she does not drink alcohol and does not use drugs.   Family History:  Her family history is not on file.   Allergies No Known Allergies   Home Medications  Prior to Admission medications   Medication Sig Start Date End Date Taking? Authorizing Provider  atorvastatin (LIPITOR) 40 MG tablet Take 1 tablet (40 mg total) by mouth every evening. 01/08/15   Cherene Altes, MD  buPROPion (WELLBUTRIN XL) 300 MG 24 hr tablet Take 300 mg by mouth daily.    [provider]  cloNIDine (CATAPRES) 0.2 MG tablet Take 2 tablets (0.4 mg total) by mouth 3 (three) times daily. 01/08/15   Cherene Altes, MD  furosemide (LASIX) 80 MG tablet Take 1 tablet (80 mg total) by mouth daily. 01/08/15   Cherene Altes, MD  gabapentin (NEURONTIN) 100 MG capsule Take 1 capsule (100 mg total) by mouth 3 (three) times daily. 01/08/15   Cherene Altes, MD  glipiZIDE (GLUCOTROL) 5 MG  tablet Take 0.5 tablets (2.5 mg total) by mouth daily before breakfast. 01/08/15   Cherene Altes, MD  hydrALAZINE (APRESOLINE) 100 MG tablet Take 1 tablet (100 mg total) by mouth every 8 (eight) hours. 01/08/15   Cherene Altes, MD  metoprolol succinate (TOPROL-XL) 100 MG 24 hr tablet Take 1 tablet (100 mg total) by mouth 2 (two) times daily. Take with or immediately following a meal. 01/05/15   Cherene Altes, MD  Multiple Vitamins-Minerals (MULTIVITAMIN WITH MINERALS) tablet Take 1 tablet by mouth daily.    [provider]  mycophenolate (CELLCEPT) 250 MG capsule Take 1,000 mg by mouth every 12 (twelve) hours.    [provider]  oxyCODONE-acetaminophen (PERCOCET/ROXICET) 5-325 MG per tablet Take 1 tablet by mouth 2 (two) times daily as needed for severe pain (Maximum dose is 2 tablets per day).    [provider]  oxyCODONE-acetaminophen (PERCOCET/ROXICET) 5-325 MG tablet Take 1 tablet by mouth every 4 (four) hours as needed for severe pain. 05/04/17   McDonald, Mia A, PA-C  predniSONE (DELTASONE) 10 MG tablet Take 10 mg by mouth 2 (two) times daily with a meal.    [provider]  tolterodine (DETROL) 2 MG tablet Take 4 mg by mouth 2 (two) times daily.    [provider]     Critical care time: 70 min.   Montey Hora, [facility] For pager details, please see AMION or use Epic chat  After 1900, please call [facility] for cross coverage needs 08/11/2021, 10:00 AM

## 2021-08-12 NOTE — ED Triage Notes (Signed)
Pt from home with multiple chronic health problems-family had trouble waking her this morning. LSN 0100 today. Pt febrile, hypotensive, and tachycardic, mildly responsive to pain.

## 2021-08-12 NOTE — Procedures (Signed)
Patient Name: Adriana Tucker  MRN: [id number]  Epilepsy Attending: Lora Havens  Referring Physician/Provider: Dr. Kathrynn Speed Date: 08/23/2021  Duration: 22.09 mins  Patient history: 40 year old female with altered mental status.  EEG to evaluate for seizure.  Level of alertness: Awake  AEDs during EEG study: Gabapentin  Technical aspects: This EEG study was done with scalp electrodes positioned according to the 10-20 International system of electrode placement. Electrical activity was acquired at a sampling rate of 500Hz  and reviewed with a high frequency filter of 70Hz  and a low frequency filter of 1Hz . EEG data were recorded continuously and digitally stored.   Description: The posterior dominant rhythm consists of 8 Hz activity of moderate voltage (25-35 uV) seen predominantly in posterior head regions, symmetric and reactive to eye opening and eye closing. Hyperventilation and photic stimulation were not performed.     IMPRESSION: This study is within normal limits. No seizures or epileptiform discharges were seen throughout the recording.  Krysia Zahradnik Barbra Sarks

## 2021-08-12 NOTE — Progress Notes (Signed)
STAT EEG complete - Neuro notified.

## 2021-08-12 NOTE — Consult Note (Signed)
[facility] Renal Consultation Note    Indication for Consultation:  Management of ESRD/hemodialysis; anemia, hypertension/volume and secondary hyperparathyroidism  PCP:Pcp, No  HPI: Adriana Tucker is a 40 y.o. female with ESRD who is currently a transient patient from [place] receiving dialysis at [facility] while visiting family for the holidays.  Past medical history includes HTN, DMT2, GERD, Lupus and hx brain aneurysm s/p repair, and cerebellar strokes w/residual L sided weakness.    Patient seen and examined at bedside in the ED with sister and mother present. History obtained from mother/sister and chart review due to patient being unresponsive/intubated/sedated.  Per family she has been in town since the beginning of November visiting for the holidays.  She has been complaining of weakness and fatigue for the last 3-4 months and has had an associated weight loss of around 50-60lbs.  Her mother reports she has been eating less and often has trouble swallow meat, saying it "gets stuck and causes her to vomit."  This does not occur daily as she has changed her diet.  She believes dialysis has been going well and states she was told they were not removing very much fluid because she was dry. She still makes urine and has frequent UTI due to chronic self catheterization.  Mother states she typically will cath herself 1x day. Her mother states the only new symptoms she had the last few days was a hacking cough.  This morning when they went to wake her she was unresponsive so they called 911.  When EMS arrived she was hypotensive and unresponsive.    Of note review of outpatient dialysis notes show she has been shortening all of her treatments on average staying 2.5hrs - 3hrs and often leaving under her dry weight.   It was lowered by 4kg earlier this month soon after she started at the center.    Pertinent findings since admission include hypotension, tachycardia, K 2.6, SCr 6.8, BUN 32,  lactic acid 2.7, CT/CTA head with no acute findings, CTA chest/abd/pelvis with no evidence aortic aneurysm/dissection, very extensive bilateral heterogeneous and consolidative airspace opacity throughout the dependent lungs, most consistent with aspiration, thickening and fat stranding of the urinary bladder wall, suggestive of cystitis, small volume ascites and anasarca and cholelithiasis.  Patient has been admitted for further evaluation and management.   Past Medical History:  Diagnosis Date   Brain aneurysm    CKD (chronic kidney disease)    due to lupus   Hypertension    Lupus ([place])    Past Surgical History:  Procedure Laterality Date   BACK SURGERY     BRAIN SURGERY     IR REPAIR TUN/NON TUN CV CATH W/O FL  07/05/2017   No family history on file. Social History:  reports that she has quit smoking. She has never used smokeless tobacco. She reports that she does not drink alcohol and does not use drugs. No Known Allergies Prior to Admission medications   Medication Sig Start Date End Date Taking? Authorizing Provider  ammonium lactate (AMLACTIN) 12 % cream Apply 1 application topically daily as needed for rash or dry skin. 03/17/21  Yes [provider]  buPROPion ER (WELLBUTRIN SR) 100 MG 12 hr tablet Take 100 mg by mouth every other day. 07/19/21  Yes [provider]  dexamethasone (DECADRON) 2 MG tablet Take 4 mg by mouth every other day. 07/19/21  Yes [provider]  fluconazole (DIFLUCAN) 100 MG tablet Take 100 mg by mouth 3 (  three) times a week. After dialysis 07/16/21  Yes [provider]  gabapentin (NEURONTIN) 100 MG capsule Take 1 capsule (100 mg total) by mouth 3 (three) times daily. Patient taking differently: Take 100 mg by mouth See admin instructions. 100mg  in the evening only on dialysis days 01/08/15  Yes Cherene Altes, MD  Multiple Vitamins-Minerals (MULTIVITAMIN WITH MINERALS) tablet Take 1 tablet by mouth daily.   Yes  [provider]  oxyCODONE-acetaminophen (PERCOCET) 10-325 MG tablet Take 1 tablet by mouth every 8 (eight) hours as needed for pain. 08/05/21  Yes [provider]  atorvastatin (LIPITOR) 40 MG tablet Take 1 tablet (40 mg total) by mouth every evening. Patient not taking: Reported on 07/30/2021 01/08/15   Cherene Altes, MD  cloNIDine (CATAPRES) 0.2 MG tablet Take 2 tablets (0.4 mg total) by mouth 3 (three) times daily. Patient not taking: Reported on 08/05/2021 01/08/15   Cherene Altes, MD  furosemide (LASIX) 80 MG tablet Take 1 tablet (80 mg total) by mouth daily. Patient not taking: Reported on 07/31/2021 01/08/15   Cherene Altes, MD  glipiZIDE (GLUCOTROL) 5 MG tablet Take 0.5 tablets (2.5 mg total) by mouth daily before breakfast. Patient not taking: Reported on 07/28/2021 01/08/15   Cherene Altes, MD  hydrALAZINE (APRESOLINE) 100 MG tablet Take 1 tablet (100 mg total) by mouth every 8 (eight) hours. Patient not taking: Reported on 08/06/2021 01/08/15   Cherene Altes, MD  metoprolol succinate (TOPROL-XL) 100 MG 24 hr tablet Take 1 tablet (100 mg total) by mouth 2 (two) times daily. Take with or immediately following a meal. Patient not taking: Reported on 08/22/2021 01/05/15   Cherene Altes, MD   Current Facility-Administered Medications  Medication Dose Route Frequency Provider Last Rate Last Admin   0.9 %  sodium chloride infusion   Intravenous Continuous PRN Kommor, Madison, MD   Stopped at 08/13/2021 1146   [START ON 08/13/2021] ceFEPIme (MAXIPIME) 1 g in sodium chloride 0.9 % 100 mL IVPB  1 g Intravenous Q24H Bertis Ruddy, RPH       docusate (COLACE) 50 MG/5ML liquid 100 mg  100 mg Per Tube BID Desai, Rahul P, PA-C       docusate (COLACE) 50 MG/5ML liquid 100 mg  100 mg Per Tube Daily PRN Jacky Kindle, MD       fentaNYL (SUBLIMAZE) 50 MCG/ML injection            fentaNYL (SUBLIMAZE) injection 50 mcg  50 mcg Intravenous Q15 min PRN Shearon Stalls, Rahul  P, PA-C   50 mcg at 08/11/2021 1239   fentaNYL (SUBLIMAZE) injection 50-200 mcg  50-200 mcg Intravenous Q30 min PRN Shearon Stalls, Rahul P, PA-C   100 mcg at 08/20/2021 1309   heparin injection 5,000 Units  5,000 Units Subcutaneous Q8H Desai, Rahul P, PA-C       hydrocortisone sodium succinate (SOLU-CORTEF) 100 MG injection 75 mg  75 mg Intravenous Q12H Desai, Rahul P, PA-C       lactated ringers infusion   Intravenous Continuous Desai, Rahul P, PA-C 100 mL/hr at 08/14/2021 1213 New Bag at 08/19/2021 1213   midazolam (VERSED) injection 2 mg  2 mg Intravenous Q15 min PRN Shearon Stalls, Rahul P, PA-C   2 mg at 08/02/2021 1308   midazolam (VERSED) injection 2 mg  2 mg Intravenous Q2H PRN Desai, Rahul P, PA-C       norepinephrine (LEVOPHED) 4mg  in 285mL premix infusion  0-60 mcg/min Intravenous Titrated Desai, Rahul P, PA-C  18.75 mL/hr at 08/18/2021 1331 5 mcg/min at 08/16/2021 1331   norepinephrine (LEVOPHED) injection   Intravenous Continuous PRN Kommor, Madison, MD   Stopped at 08/14/2021 1146   pantoprazole (PROTONIX) injection 40 mg  40 mg Intravenous QHS Desai, Rahul P, PA-C       polyethylene glycol (MIRALAX / GLYCOLAX) packet 17 g  17 g Per Tube Daily PRN Desai, Rahul P, PA-C       polyethylene glycol (MIRALAX / GLYCOLAX) packet 17 g  17 g Per Tube Daily Desai, Rahul P, PA-C       rocuronium (ZEMURON) injection   Intravenous PRN Kommor, Madison, MD   80 mg at 07/29/2021 0853   vancomycin variable dose per unstable renal function (pharmacist dosing)   Does not apply See admin instructions Bertis Ruddy, RPH       vasopressin (PITRESSIN) 20 Units in sodium chloride 0.9 % 100 mL infusion-*FOR SHOCK*  0-0.03 Units/min Intravenous Continuous Kommor, Madison, MD   Stopped at 08/18/2021 1253   Current Outpatient Medications  Medication Sig Dispense Refill   ammonium lactate (AMLACTIN) 12 % cream Apply 1 application topically daily as needed for rash or dry skin.     buPROPion ER (WELLBUTRIN SR) 100 MG 12 hr tablet Take 100 mg by  mouth every other day.     dexamethasone (DECADRON) 2 MG tablet Take 4 mg by mouth every other day.     fluconazole (DIFLUCAN) 100 MG tablet Take 100 mg by mouth 3 (three) times a week. After dialysis     gabapentin (NEURONTIN) 100 MG capsule Take 1 capsule (100 mg total) by mouth 3 (three) times daily. (Patient taking differently: Take 100 mg by mouth See admin instructions. 100mg  in the evening only on dialysis days) 90 capsule 0   Multiple Vitamins-Minerals (MULTIVITAMIN WITH MINERALS) tablet Take 1 tablet by mouth daily.     oxyCODONE-acetaminophen (PERCOCET) 10-325 MG tablet Take 1 tablet by mouth every 8 (eight) hours as needed for pain.     atorvastatin (LIPITOR) 40 MG tablet Take 1 tablet (40 mg total) by mouth every evening. (Patient not taking: Reported on 08/22/2021) 30 tablet 0   cloNIDine (CATAPRES) 0.2 MG tablet Take 2 tablets (0.4 mg total) by mouth 3 (three) times daily. (Patient not taking: Reported on 08/09/2021) 180 tablet 0   furosemide (LASIX) 80 MG tablet Take 1 tablet (80 mg total) by mouth daily. (Patient not taking: Reported on 08/10/2021) 30 tablet 0   glipiZIDE (GLUCOTROL) 5 MG tablet Take 0.5 tablets (2.5 mg total) by mouth daily before breakfast. (Patient not taking: Reported on 08/02/2021)     hydrALAZINE (APRESOLINE) 100 MG tablet Take 1 tablet (100 mg total) by mouth every 8 (eight) hours. (Patient not taking: Reported on 08/22/2021) 90 tablet 0   metoprolol succinate (TOPROL-XL) 100 MG 24 hr tablet Take 1 tablet (100 mg total) by mouth 2 (two) times daily. Take with or immediately following a meal. (Patient not taking: Reported on 07/28/2021) 60 tablet 0   Labs: Basic Metabolic Panel: Recent Labs  Lab 07/31/2021 0830 08/07/2021 0849 08/20/2021 1045  NA 136 137 137  K 2.9* 2.8* 2.6*  CL 102 100  --   CO2 23  --   --   GLUCOSE 86 82  --   BUN 37* 32*  --   CREATININE 6.37* 6.80*  --   CALCIUM 7.4*  --   --    Liver Function Tests: Recent Labs  Lab  08/11/2021 0830  AST  17  ALT 16  ALKPHOS 33*  BILITOT 1.1  PROT 4.0*  ALBUMIN 2.0*    Recent Labs  Lab 08/21/2021 0830  AMMONIA 17   CBC: Recent Labs  Lab 08/19/2021 0830 08/21/2021 0849 07/28/2021 1045  WBC 2.0*  --   --   NEUTROABS 1.4*  --   --   HGB 7.7* 7.5* 8.2*  HCT 25.3* 22.0* 24.0*  MCV 92.7  --   --   PLT 50*  --   --     CBG: Recent Labs  Lab 07/27/2021 0834  GLUCAP 83   Studies/Results: CT ANGIO HEAD NECK W WO CM  Result Date: 08/09/2021 CLINICAL DATA:  Unresponsive.  History of aneurysm. EXAM: CT ANGIOGRAPHY HEAD AND NECK TECHNIQUE: Multidetector CT imaging of the head and neck was performed using the standard protocol during bolus administration of intravenous contrast. Multiplanar CT image reconstructions and MIPs were obtained to evaluate the vascular anatomy. Carotid stenosis measurements (when applicable) are obtained utilizing NASCET criteria, using the distal internal carotid diameter as the denominator. CONTRAST:  149mL OMNIPAQUE IOHEXOL 350 MG/ML SOLN COMPARISON:  MRI head and MRA head and neck 12/30/2014. CT head 08/01/2021 FINDINGS: CTA NECK FINDINGS Aortic arch: Proximal great vessels widely patent. Bovine branching arch. Right carotid system: Right carotid widely patent. Negative for stenosis or atherosclerotic disease. Left carotid system: Left carotid bifurcation widely patent. There is small caliber of the left internal carotid artery throughout the neck and skull base without focal stenosis or dissection. Review of the prior MRA neck without contrast 12/30/2014 reveals progression of small caliber left internal carotid artery. This may be due to decreased outflow with hypoplastic left A1 segment. Vertebral arteries: Both vertebral arteries patent to the basilar. Skeleton: Bilateral laminectomy C3 through C7. Surgical clip in the spinal canal on the right at C5-6. This may be a vascular clip. No vascular malformation is identified in this area. Other neck:  Patient is intubated. No mass in the neck. Right jugular central venous catheter tip. Upper chest: Mild atelectasis left upper lobe. Right upper lobe clear. Review of the MIP images confirms the above findings CTA HEAD FINDINGS Anterior circulation: Cavernous carotid patent bilaterally. Left cavernous carotid smaller than the right. Hypoplastic but patent left A1 segment. Right A1 segment normal in size. Both anterior cerebral arteries patent. Middle cerebral arteries are patent bilaterally without stenosis or aneurysm. Posterior circulation: Both vertebral arteries patent to the basilar. PICA patent bilaterally. Basilar widely patent. Superior cerebellar and posterior cerebral arteries patent bilaterally without stenosis. No aneurysm in the posterior circulation. Venous sinuses: Normal venous enhancement Anatomic variants: None Review of the MIP images confirms the above findings IMPRESSION: 1. No intracranial aneurysm. No intracranial stenosis or large vessel occlusion 2. Normal right carotid artery 3. Diffusely small caliber left internal carotid artery without focal stenosis. This may be due to a decreased outflow with hypoplastic left A1 segment. 4. Cervical laminectomy C3 through C7. Surgical vascular clips in the spinal canal on the right at C5-6. No vascular malformation is present in the cervical spine. Electronically Signed   By: Franchot Gallo M.D.   On: 08/13/2021 10:15   CT Head Wo Contrast  Result Date: 08/03/2021 CLINICAL DATA:  Family had trouble waking the patient up this morning, altered mental status EXAM: CT HEAD WITHOUT CONTRAST TECHNIQUE: Contiguous axial images were obtained from the base of the skull through the vertex without intravenous contrast. COMPARISON:  None. FINDINGS: Brain: There is no evidence of acute intracranial hemorrhage, extra-axial  fluid collection, or acute infarct. There are remote infarcts in the bilateral cerebellar hemispheres, left larger than right. There is mild  global parenchymal volume loss, greater than expected for age. The ventricles are not enlarged. There is no solid mass lesion. There is no midline shift. An enlarged empty sella is noted, likely incidental. Vascular: No hyperdense vessel or unexpected calcification. Skull: Normal. Negative for fracture or focal lesion. Sinuses/Orbits: There are secretions in the imaged nasopharynx. The imaged paranasal sinuses are clear. The globes and orbits are unremarkable. Other: There is advanced degenerative change of the temporomandibular joints. IMPRESSION: 1. No acute intracranial pathology. 2. 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 3. Mild global parenchymal volume loss, slightly greater than expected for age. Electronically Signed   By: Valetta Mole M.D.   On: 08/07/2021 09:23   EEG adult  Result Date: 07/28/2021 Lora Havens, MD     08/12/2021 12:31 PM Patient Name: MAUDE HETTICH MRN: [id number] Epilepsy Attending: Lora Havens Referring Physician/Provider: Dr. Kathrynn Speed Date: 08/15/2021 Duration: 22.09 mins Patient history: 40 year old female with altered mental status.  EEG to evaluate for seizure. Level of alertness: Awake AEDs during EEG study: Gabapentin Technical aspects: This EEG study was done with scalp electrodes positioned according to the 10-20 International system of electrode placement. Electrical activity was acquired at a sampling rate of 500Hz  and reviewed with a high frequency filter of 70Hz  and a low frequency filter of 1Hz . EEG data were recorded continuously and digitally stored. Description: The posterior dominant rhythm consists of 8 Hz activity of moderate voltage (25-35 uV) seen predominantly in posterior head regions, symmetric and reactive to eye opening and eye closing. Hyperventilation and photic stimulation were not performed.   IMPRESSION: This study is within normal limits. No seizures or epileptiform discharges were seen  throughout the recording. Lora Havens   CT Angio Chest/Abd/Pel for Dissection W and/or Wo Contrast  Result Date: 08/09/2021 CLINICAL DATA:  Chest pain, aortic dissection suspected, difficulty arousing this morning EXAM: CT ANGIOGRAPHY CHEST, ABDOMEN AND PELVIS TECHNIQUE: Non-contrast CT of the chest was initially obtained. Multidetector CT imaging through the chest, abdomen and pelvis was performed using the standard protocol during bolus administration of intravenous contrast. Multiplanar reconstructed images and MIPs were obtained and reviewed to evaluate the vascular anatomy. CONTRAST:  138mL OMNIPAQUE IOHEXOL 350 MG/ML SOLN COMPARISON:  None. FINDINGS: CTA CHEST FINDINGS Cardiovascular: Preferential opacification of the thoracic aorta. Normal contour and caliber of the thoracic aorta. No evidence of aneurysm, dissection, or other acute aortic pathology. Normal heart size. No pericardial effusion. Large-bore right neck vascular catheter. Mediastinum/Nodes: No enlarged mediastinal, hilar, or axillary lymph nodes. Thyroid gland, trachea, and esophagus demonstrate no significant findings. Lungs/Pleura: Endotracheal intubation, endotracheal tube tip just above the ostium of the right mainstem bronchus (series 11, image 54). Very extensive bilateral heterogeneous and consolidative airspace opacity throughout the dependent lungs. Trace bilateral pleural effusions. Musculoskeletal: No chest wall abnormality. No acute or significant osseous findings. Review of the MIP images confirms the above findings. CTA ABDOMEN AND PELVIS FINDINGS VASCULAR Normal contour and caliber of the abdominal aorta. No evidence of aneurysm, dissection, or other acute aortic pathology. Standard branching pattern of the aorta with solitary bilateral renal arteries. No significant atherosclerosis. Infrarenal IVC filter. Review of the MIP images confirms the above findings. NON-VASCULAR Hepatobiliary: No solid liver abnormality is seen.  Gallstones. No gallbladder wall thickening, or biliary dilatation. Pancreas: Unremarkable. No pancreatic ductal dilatation or surrounding inflammatory  changes. Spleen: Normal in size without significant abnormality. Adrenals/Urinary Tract: Adrenal glands are unremarkable. Kidneys are normal, without renal calculi, solid lesion, or hydronephrosis. Thickening and fat stranding of the urinary bladder wall (series 16, image 74). Stomach/Bowel: Stomach is within normal limits. Appendix appears normal. No evidence of bowel wall thickening, distention, or inflammatory changes. Lymphatic: No enlarged abdominal or pelvic lymph nodes. Reproductive: No mass or other significant abnormality. Other: No abdominal wall hernia. Anasarca. Small volume ascites throughout the abdomen and pelvis. Musculoskeletal: No acute or significant osseous findings. Renal osteodystrophy. Review of the MIP images confirms the above findings. IMPRESSION: 1. Normal contour and caliber of the thoracic and abdominal aorta. No evidence of aneurysm, dissection, or other acute aortic pathology. No significant atherosclerosis. 2. Endotracheal intubation, endotracheal tube tip just above the of the right mainstem bronchus. Recommend slight retraction. 3. Very extensive bilateral heterogeneous and consolidative airspace opacity throughout the dependent lungs, most consistent with aspiration. 4. Thickening and fat stranding of the urinary bladder wall, suggestive of cystitis. Correlate with urinalysis. 5. Small volume ascites and anasarca. 6. Cholelithiasis. Electronically Signed   By: Delanna Ahmadi M.D.   On: 08/05/2021 10:09    ROS: Unable to complete due to patient being unresponsive   Physical Exam: Vitals:   08/21/2021 1245 08/15/2021 1300 08/22/2021 1345 08/20/2021 1400  BP:    101/63  Pulse:    (!) 123  Resp: (!) 0 (!) 22 (!) 8 (!) 7  Temp:      TempSrc:      SpO2:    98%  Height:         General: intubated and sedated chronically ill appearing  female in mild distress Head: NCAT +sclera icterus  Neck: Supple.  Lungs: on vent, +diffuse rhonchi b/l Heart: RRR. No murmur, rubs or gallops.  Abdomen: soft, nontender, +BS, +ascites Lower extremities:2+ edema in calves/feet Neuro: unresponsive, sedated  Dialysis Access: [facility]  Dialysis Orders:  TTS - transient at AF  3.5hrs, BFR 300, DFR 500,  EDW 55kg, 2K/ 2Ca  Access: TDC  Heparin 3000 Mircera 30 mcg q2wks - ordered but not yet given Hectorol 31mcg IV qHD    Assessment/Plan:  Acute encephalopathy - etiology unclear. +hypotension. Possible aspiration PNA and cystitis noted on CTA chest./ab/pelvic. CT/CTA head neck with no acute findings. Ongoing workup per PMD. Respiratory distress - on vent.  Possible aspiration PNA on CTA chest Cystitis - noted on CTA with bladder wall thickening.  Hs recurrent UTI per family d/t urinary retention and chronic self catheterization  ESRD -  on HD TTS as transient in [place].  Non compliant with HD while here.  Renal labs stable.  No absolute indication to do dialysis today.  Will hold off for now until more hemodynamically stable and reassess in the AM.  Hypokalemia - K 2.6. supplement ordered by PMD.  Hypotension/Volume  - on pressors.  Lower extremity edema present on exam and anasarca noted on CT.  Has been losing weight and EDW recently lowered but likely needs additional lowering.  Will work on improving volume status once more hemodynamically stable.   Anemia of CKD - Hgb 8.2. ESA ordered as OP but not yet given.  Will order to be given with HD.  Secondary Hyperparathyroidism -  Corrected Ca ok.  Will check phos.  Continue hectorol with HD.  Nutrition - NPO Lupus nephritis - stress dose steroids ordered. Cellcept on hold. GERD DMT2  Jen Mow, PA-C [facility] 08/08/2021, 2:23 PM

## 2021-08-12 NOTE — ED Notes (Signed)
Multiple attempts for second set of blood cultures by phlebotomy and nursing, unsuccessful

## 2021-08-12 NOTE — Plan of Care (Signed)

## 2021-08-12 NOTE — Progress Notes (Signed)
Pharmacy Antibiotic Note  Adriana Tucker is a 40 y.o. female admitted on 08/22/2021 presenting as unresponsive, concern for sepsis.  Pharmacy has been consulted for vancomycin and cefepime dosing.  ESRD-HD, dry wt may be ~ 52kg  Plan: Vancomycin 1000 mg IV x 1, then 500 mg IV qHD or q24 if CRRT Cefepime 2g IV x 1, then 1g IV q 24h Monitor HD plans, Cx and clinical progression to narrow Vancomycin levels as needed  Height: 5\' 1"  (154.9 cm) IBW/kg (Calculated) : 47.8  Temp (24hrs), Avg:97.5 F (36.4 C), Min:97.5 F (36.4 C), Max:97.5 F (36.4 C)  Recent Labs  Lab 08/09/2021 0830 08/10/2021 0849  CREATININE 6.37* 6.80*  LATICACIDVEN 2.7*  --     CrCl cannot be calculated (Unknown ideal weight.).    No Known Allergies  Bertis Ruddy, PharmD Clinical Pharmacist ED Pharmacist Phone # [phone] 07/29/2021 10:20 AM

## 2021-08-12 NOTE — Progress Notes (Addendum)
[facility] Progress Note Patient Name: ALDEEN RIGA DOB: [birth year] MRN: [id number]   Date of Service  08/07/2021  HPI/Events of Note  Patient with bloody OG tube output, also hypokalemia.  eICU Interventions  Heparin held, CBC, coagulation parameters, and KUB ordered, iv PPI ordered. BMP ordered to follow up hypokalemia.        Kerry Kass Ameir Faria 08/08/2021, 9:28 PM

## 2021-08-12 NOTE — Progress Notes (Signed)
eLink Physician-Brief Progress Note Patient Name: Adriana Tucker DOB: [birth year] MRN: [id number]   Date of Service  08/06/2021  HPI/Events of Note  Blood sugar 70 mg / dl. Patient is on LR gtt at 100 ml / hour.  eICU Interventions  LR discontinued. D 10 % water gtt ordered at 25 ml / hour. Norepinephrine gtt quad concentrated.        Kerry Kass Emanuel Campos 08/14/2021, 8:01 PM

## 2021-08-13 ENCOUNTER — Inpatient Hospital Stay (HOSPITAL_COMMUNITY): Payer: Medicare Other

## 2021-08-13 DIAGNOSIS — G9340 Encephalopathy, unspecified: Secondary | ICD-10-CM | POA: Diagnosis not present

## 2021-08-13 DIAGNOSIS — I5030 Unspecified diastolic (congestive) heart failure: Secondary | ICD-10-CM

## 2021-08-13 LAB — POCT I-STAT 7, (LYTES, BLD GAS, ICA,H+H)
Acid-base deficit: 4 mmol/L — ABNORMAL HIGH (ref 0.0–2.0)
Acid-base deficit: 6 mmol/L — ABNORMAL HIGH (ref 0.0–2.0)
Acid-base deficit: 6 mmol/L — ABNORMAL HIGH (ref 0.0–2.0)
Acid-base deficit: 7 mmol/L — ABNORMAL HIGH (ref 0.0–2.0)
Acid-base deficit: 8 mmol/L — ABNORMAL HIGH (ref 0.0–2.0)
Acid-base deficit: 8 mmol/L — ABNORMAL HIGH (ref 0.0–2.0)
Acid-base deficit: 8 mmol/L — ABNORMAL HIGH (ref 0.0–2.0)
Bicarbonate: 15.5 mmol/L — ABNORMAL LOW (ref 20.0–28.0)
Bicarbonate: 16.1 mmol/L — ABNORMAL LOW (ref 20.0–28.0)
Bicarbonate: 17 mmol/L — ABNORMAL LOW (ref 20.0–28.0)
Bicarbonate: 17.1 mmol/L — ABNORMAL LOW (ref 20.0–28.0)
Bicarbonate: 20.8 mmol/L (ref 20.0–28.0)
Bicarbonate: 21.2 mmol/L (ref 20.0–28.0)
Bicarbonate: 22.1 mmol/L (ref 20.0–28.0)
Calcium, Ion: 1.06 mmol/L — ABNORMAL LOW (ref 1.15–1.40)
Calcium, Ion: 1.07 mmol/L — ABNORMAL LOW (ref 1.15–1.40)
Calcium, Ion: 1.07 mmol/L — ABNORMAL LOW (ref 1.15–1.40)
Calcium, Ion: 1.12 mmol/L — ABNORMAL LOW (ref 1.15–1.40)
Calcium, Ion: 1.17 mmol/L (ref 1.15–1.40)
Calcium, Ion: 1.18 mmol/L (ref 1.15–1.40)
Calcium, Ion: 1.18 mmol/L (ref 1.15–1.40)
HCT: 22 % — ABNORMAL LOW (ref 36.0–46.0)
HCT: 23 % — ABNORMAL LOW (ref 36.0–46.0)
HCT: 23 % — ABNORMAL LOW (ref 36.0–46.0)
HCT: 24 % — ABNORMAL LOW (ref 36.0–46.0)
HCT: 25 % — ABNORMAL LOW (ref 36.0–46.0)
HCT: 31 % — ABNORMAL LOW (ref 36.0–46.0)
HCT: 33 % — ABNORMAL LOW (ref 36.0–46.0)
Hemoglobin: 10.5 g/dL — ABNORMAL LOW (ref 12.0–15.0)
Hemoglobin: 11.2 g/dL — ABNORMAL LOW (ref 12.0–15.0)
Hemoglobin: 7.5 g/dL — ABNORMAL LOW (ref 12.0–15.0)
Hemoglobin: 7.8 g/dL — ABNORMAL LOW (ref 12.0–15.0)
Hemoglobin: 7.8 g/dL — ABNORMAL LOW (ref 12.0–15.0)
Hemoglobin: 8.2 g/dL — ABNORMAL LOW (ref 12.0–15.0)
Hemoglobin: 8.5 g/dL — ABNORMAL LOW (ref 12.0–15.0)
O2 Saturation: 100 %
O2 Saturation: 100 %
O2 Saturation: 100 %
O2 Saturation: 84 %
O2 Saturation: 98 %
O2 Saturation: 98 %
O2 Saturation: 99 %
Patient temperature: 98.4
Patient temperature: 98.4
Patient temperature: 98.7
Patient temperature: 98.7
Patient temperature: 98.7
Patient temperature: 98.7
Patient temperature: 99.5
Potassium: 3.7 mmol/L (ref 3.5–5.1)
Potassium: 3.8 mmol/L (ref 3.5–5.1)
Potassium: 3.9 mmol/L (ref 3.5–5.1)
Potassium: 4.1 mmol/L (ref 3.5–5.1)
Potassium: 4.2 mmol/L (ref 3.5–5.1)
Potassium: 4.3 mmol/L (ref 3.5–5.1)
Potassium: 4.3 mmol/L (ref 3.5–5.1)
Sodium: 126 mmol/L — ABNORMAL LOW (ref 135–145)
Sodium: 131 mmol/L — ABNORMAL LOW (ref 135–145)
Sodium: 131 mmol/L — ABNORMAL LOW (ref 135–145)
Sodium: 131 mmol/L — ABNORMAL LOW (ref 135–145)
Sodium: 132 mmol/L — ABNORMAL LOW (ref 135–145)
Sodium: 132 mmol/L — ABNORMAL LOW (ref 135–145)
Sodium: 132 mmol/L — ABNORMAL LOW (ref 135–145)
TCO2: 16 mmol/L — ABNORMAL LOW (ref 22–32)
TCO2: 17 mmol/L — ABNORMAL LOW (ref 22–32)
TCO2: 18 mmol/L — ABNORMAL LOW (ref 22–32)
TCO2: 18 mmol/L — ABNORMAL LOW (ref 22–32)
TCO2: 22 mmol/L (ref 22–32)
TCO2: 23 mmol/L (ref 22–32)
TCO2: 23 mmol/L (ref 22–32)
pCO2 arterial: 25.5 mmHg — ABNORMAL LOW (ref 32.0–48.0)
pCO2 arterial: 28.6 mmHg — ABNORMAL LOW (ref 32.0–48.0)
pCO2 arterial: 30 mmHg — ABNORMAL LOW (ref 32.0–48.0)
pCO2 arterial: 30.9 mmHg — ABNORMAL LOW (ref 32.0–48.0)
pCO2 arterial: 46.4 mmHg (ref 32.0–48.0)
pCO2 arterial: 48.8 mmHg — ABNORMAL HIGH (ref 32.0–48.0)
pCO2 arterial: 49.2 mmHg — ABNORMAL HIGH (ref 32.0–48.0)
pH, Arterial: 7.236 — ABNORMAL LOW (ref 7.350–7.450)
pH, Arterial: 7.242 — ABNORMAL LOW (ref 7.350–7.450)
pH, Arterial: 7.284 — ABNORMAL LOW (ref 7.350–7.450)
pH, Arterial: 7.349 — ABNORMAL LOW (ref 7.350–7.450)
pH, Arterial: 7.36 (ref 7.350–7.450)
pH, Arterial: 7.365 (ref 7.350–7.450)
pH, Arterial: 7.391 (ref 7.350–7.450)
pO2, Arterial: 109 mmHg — ABNORMAL HIGH (ref 83.0–108.0)
pO2, Arterial: 112 mmHg — ABNORMAL HIGH (ref 83.0–108.0)
pO2, Arterial: 164 mmHg — ABNORMAL HIGH (ref 83.0–108.0)
pO2, Arterial: 253 mmHg — ABNORMAL HIGH (ref 83.0–108.0)
pO2, Arterial: 260 mmHg — ABNORMAL HIGH (ref 83.0–108.0)
pO2, Arterial: 291 mmHg — ABNORMAL HIGH (ref 83.0–108.0)
pO2, Arterial: 48 mmHg — ABNORMAL LOW (ref 83.0–108.0)

## 2021-08-13 LAB — URINALYSIS, ROUTINE W REFLEX MICROSCOPIC
Bilirubin Urine: NEGATIVE
Glucose, UA: NEGATIVE mg/dL
Ketones, ur: NEGATIVE mg/dL
Nitrite: NEGATIVE
Protein, ur: 100 mg/dL — AB
Specific Gravity, Urine: 1.011 (ref 1.005–1.030)
WBC, UA: >50 WBC/hpf — ABNORMAL HIGH (ref 0–5)
pH: 8 (ref 5.0–8.0)

## 2021-08-13 LAB — GLUCOSE, CAPILLARY
Glucose-Capillary: 100 mg/dL — ABNORMAL HIGH (ref 70–99)
Glucose-Capillary: 117 mg/dL — ABNORMAL HIGH (ref 70–99)
Glucose-Capillary: 52 mg/dL — ABNORMAL LOW (ref 70–99)
Glucose-Capillary: 86 mg/dL (ref 70–99)
Glucose-Capillary: 87 mg/dL (ref 70–99)
Glucose-Capillary: 88 mg/dL (ref 70–99)
Glucose-Capillary: 120 mg/dL — ABNORMAL HIGH (ref 70–99)
Glucose-Capillary: 154 mg/dL — ABNORMAL HIGH (ref 70–99)

## 2021-08-13 LAB — RESPIRATORY PANEL BY PCR

## 2021-08-13 LAB — BODY FLUID CELL COUNT WITH DIFFERENTIAL
Eos, Fluid: 0 %
Lymphs, Fluid: 3 %
Monocyte-Macrophage-Serous Fluid: 19 % — ABNORMAL LOW (ref 50–90)
Neutrophil Count, Fluid: 78 % — ABNORMAL HIGH (ref 0–25)
Total Nucleated Cell Count, Fluid: 1825 cu mm — ABNORMAL HIGH (ref 0–1000)

## 2021-08-13 LAB — CBC
HCT: 26.9 % — ABNORMAL LOW (ref 36.0–46.0)
Hemoglobin: 8.7 g/dL — ABNORMAL LOW (ref 12.0–15.0)
MCH: 28.2 pg (ref 26.0–34.0)
MCHC: 32.3 g/dL (ref 30.0–36.0)
MCV: 87.1 fL (ref 80.0–100.0)
Platelets: 49 10*3/uL — ABNORMAL LOW (ref 150–400)
RBC: 3.09 MIL/uL — ABNORMAL LOW (ref 3.87–5.11)
RDW: 15.8 % — ABNORMAL HIGH (ref 11.5–15.5)
WBC: 1.4 10*3/uL — CL (ref 4.0–10.5)
nRBC: 0 % (ref 0.0–0.2)

## 2021-08-13 LAB — BASIC METABOLIC PANEL
Anion gap: 15 (ref 5–15)
BUN: 43 mg/dL — ABNORMAL HIGH (ref 6–20)
CO2: 16 mmol/L — ABNORMAL LOW (ref 22–32)
Calcium: 7.8 mg/dL — ABNORMAL LOW (ref 8.9–10.3)
Chloride: 100 mmol/L (ref 98–111)
Creatinine, Ser: 6.27 mg/dL — ABNORMAL HIGH (ref 0.44–1.00)
GFR, Estimated: 8 mL/min — ABNORMAL LOW (ref >60)
Glucose, Bld: 124 mg/dL — ABNORMAL HIGH (ref 70–99)
Potassium: 4.1 mmol/L (ref 3.5–5.1)
Sodium: 131 mmol/L — ABNORMAL LOW (ref 135–145)

## 2021-08-13 LAB — FERRITIN: Ferritin: >7500 ng/mL — ABNORMAL HIGH (ref 11–307)

## 2021-08-13 LAB — ECHOCARDIOGRAM COMPLETE
AV Mean grad: 11 mmHg
AV Peak grad: 20 mmHg
Ao pk vel: 2.23 m/s
Area-P 1/2: 3.85 cm2
Calc EF: 54.7 %
Height: 61 in
MV M vel: 3.36 m/s
MV Peak grad: 45.2 mmHg
Radius: 0.5 cm
S' Lateral: 2.3 cm
Single Plane A2C EF: 54.6 %
Single Plane A4C EF: 54.4 %
Weight: 2186.96 oz

## 2021-08-13 LAB — HCG, QUANTITATIVE, PREGNANCY: hCG, Beta Chain, Quant, S: 2 m[IU]/mL (ref <5)

## 2021-08-13 LAB — LACTATE DEHYDROGENASE
LDH: 426 U/L — ABNORMAL HIGH (ref 98–192)
LDH: 189 U/L (ref 98–192)

## 2021-08-13 LAB — IRON AND TIBC: Iron: 19 ug/dL — ABNORMAL LOW (ref 28–170)

## 2021-08-13 LAB — PHOSPHORUS: Phosphorus: 5.3 mg/dL — ABNORMAL HIGH (ref 2.5–4.6)

## 2021-08-13 LAB — MAGNESIUM
Magnesium: 1.3 mg/dL — ABNORMAL LOW (ref 1.7–2.4)
Magnesium: 2.8 mg/dL — ABNORMAL HIGH (ref 1.7–2.4)

## 2021-08-13 MED ORDER — DEXTROSE 50 % IV SOLN
INTRAVENOUS | Status: AC
  Filled 2021-08-13: qty 50

## 2021-08-13 MED ORDER — FENTANYL 2500MCG IN NS 250ML (10MCG/ML) PREMIX INFUSION
0.0000 ug/h | INTRAVENOUS | Status: AC
  Administered 2021-08-13: 22:00:00 200 ug/h via INTRAVENOUS
  Administered 2021-08-13: 25 ug/h via INTRAVENOUS
  Administered 2021-08-14 (×2): 200 ug/h via INTRAVENOUS
  Administered 2021-08-15: 250 ug/h via INTRAVENOUS
  Filled 2021-08-13 (×5): qty 250

## 2021-08-13 MED ORDER — VANCOMYCIN HCL 750 MG/150ML IV SOLN
750.0000 mg | INTRAVENOUS | Status: DC
  Administered 2021-08-13 – 2021-08-15 (×3): 750 mg via INTRAVENOUS
  Filled 2021-08-13 (×3): qty 150

## 2021-08-13 MED ORDER — SODIUM CHLORIDE 0.9 % IV SOLN
100.0000 mg | Freq: Two times a day (BID) | INTRAVENOUS | Status: DC
  Administered 2021-08-13 – 2021-08-16 (×7): 100 mg via INTRAVENOUS
  Filled 2021-08-13 (×8): qty 100

## 2021-08-13 MED ORDER — SODIUM CHLORIDE 0.9% FLUSH: 10.0000 mL | INTRAVENOUS | Status: DC | PRN

## 2021-08-13 MED ORDER — MAGNESIUM SULFATE 2 GM/50ML IV SOLN
2.0000 g | Freq: Once | INTRAVENOUS | Status: AC
  Administered 2021-08-13: 2 g via INTRAVENOUS
  Filled 2021-08-13: qty 50

## 2021-08-13 MED ORDER — PRISMASOL BGK 4/2.5 32-4-2.5 MEQ/L REPLACEMENT SOLN
Status: DC
  Filled 2021-08-13 (×7): qty 5000

## 2021-08-13 MED ORDER — PRISMASOL BGK 4/2.5 32-4-2.5 MEQ/L REPLACEMENT SOLN
Status: DC
  Filled 2021-08-13 (×11): qty 5000

## 2021-08-13 MED ORDER — SODIUM CHLORIDE 0.9 % IV SOLN
100.0000 mg | Freq: Once | INTRAVENOUS | Status: DC
  Filled 2021-08-13: qty 100

## 2021-08-13 MED ORDER — SODIUM CHLORIDE 0.9 % FOR CRRT: INTRAVENOUS_CENTRAL | Status: DC | PRN

## 2021-08-13 MED ORDER — HYDROCORTISONE SOD SUC (PF) 100 MG IJ SOLR
100.0000 mg | Freq: Two times a day (BID) | INTRAMUSCULAR | Status: DC
  Administered 2021-08-13 – 2021-08-16 (×8): 100 mg via INTRAVENOUS
  Filled 2021-08-13 (×8): qty 2

## 2021-08-13 MED ORDER — DARBEPOETIN ALFA 40 MCG/0.4ML IJ SOSY
40.0000 ug | PREFILLED_SYRINGE | INTRAMUSCULAR | Status: DC
  Filled 2021-08-13 (×2): qty 0.4

## 2021-08-13 MED ORDER — PANTOPRAZOLE SODIUM 40 MG IV SOLR
40.0000 mg | Freq: Two times a day (BID) | INTRAVENOUS | Status: DC
  Administered 2021-08-14 – 2021-08-16 (×6): 40 mg via INTRAVENOUS
  Filled 2021-08-13 (×7): qty 40

## 2021-08-13 MED ORDER — FENTANYL CITRATE (PF) 100 MCG/2ML IJ SOLN
50.0000 ug | INTRAMUSCULAR | Status: DC | PRN
Start: 2021-08-13 — End: 2021-08-17
  Administered 2021-08-13: 100 ug via INTRAVENOUS
  Administered 2021-08-14 – 2021-08-15 (×5): 50 ug via INTRAVENOUS

## 2021-08-13 MED ORDER — MAGNESIUM SULFATE 4 GM/100ML IV SOLN
4.0000 g | Freq: Once | INTRAVENOUS | Status: AC
  Administered 2021-08-13: 4 g via INTRAVENOUS
  Filled 2021-08-13: qty 100

## 2021-08-13 MED ORDER — FENTANYL CITRATE (PF) 100 MCG/2ML IJ SOLN
INTRAMUSCULAR | Status: AC
  Administered 2021-08-13: 100 ug
  Filled 2021-08-13: qty 2

## 2021-08-13 MED ORDER — SODIUM CHLORIDE 0.9 % IV SOLN
2.0000 g | Freq: Two times a day (BID) | INTRAVENOUS | Status: DC
  Administered 2021-08-13 – 2021-08-16 (×7): 2 g via INTRAVENOUS
  Filled 2021-08-13 (×7): qty 2

## 2021-08-13 MED ORDER — SODIUM CHLORIDE 0.9 % IV SOLN
100.0000 mg | INTRAVENOUS | Status: DC
  Administered 2021-08-14 – 2021-08-16 (×3): 100 mg via INTRAVENOUS
  Filled 2021-08-13 (×4): qty 100

## 2021-08-13 MED ORDER — CHLORHEXIDINE GLUCONATE CLOTH 2 % EX PADS
6.0000 | MEDICATED_PAD | Freq: Every day | CUTANEOUS | Status: DC
Start: 2021-08-13 — End: 2021-08-17
  Administered 2021-08-13 – 2021-08-16 (×4): 6 via TOPICAL

## 2021-08-13 MED ORDER — SODIUM CHLORIDE 0.9% FLUSH
10.0000 mL | Freq: Two times a day (BID) | INTRAVENOUS | Status: DC
Start: 2021-08-13 — End: 2021-08-17
  Administered 2021-08-14: 19 mL
  Administered 2021-08-14 – 2021-08-16 (×4): 10 mL

## 2021-08-13 MED ORDER — HYDROCORTISONE SOD SUC (PF) 100 MG IJ SOLR: 100.0000 mg | Freq: Two times a day (BID) | INTRAMUSCULAR | Status: DC

## 2021-08-13 MED ORDER — SODIUM CHLORIDE 0.9 % IV SOLN
200.0000 mg | Freq: Once | INTRAVENOUS | Status: AC
  Administered 2021-08-13: 200 mg via INTRAVENOUS
  Filled 2021-08-13: qty 200

## 2021-08-13 MED ORDER — NOREPINEPHRINE 16 MG/250ML-% IV SOLN
0.0000 ug/min | INTRAVENOUS | Status: DC
  Administered 2021-08-13: 30 ug/min via INTRAVENOUS
  Administered 2021-08-13: 26 ug/min via INTRAVENOUS
  Administered 2021-08-14 (×2): 14 ug/min via INTRAVENOUS
  Administered 2021-08-15: 17 ug/min via INTRAVENOUS
  Administered 2021-08-16: 24 ug/min via INTRAVENOUS
  Administered 2021-08-16: 29 ug/min via INTRAVENOUS
  Filled 2021-08-13 (×7): qty 250

## 2021-08-13 MED ORDER — PROPOFOL 1000 MG/100ML IV EMUL
5.0000 ug/kg/min | INTRAVENOUS | Status: DC
  Administered 2021-08-13: 40 ug/kg/min via INTRAVENOUS
  Administered 2021-08-13: 20 ug/kg/min via INTRAVENOUS
  Administered 2021-08-14: 25 ug/kg/min via INTRAVENOUS
  Filled 2021-08-13 (×4): qty 100

## 2021-08-13 MED ORDER — HEPARIN SODIUM (PORCINE) 1000 UNIT/ML DIALYSIS
1000.0000 [IU] | INTRAMUSCULAR | Status: DC | PRN
  Administered 2021-08-16: 4000 [IU] via INTRAVENOUS_CENTRAL
  Filled 2021-08-13 (×4): qty 6

## 2021-08-13 MED ORDER — ALTEPLASE 2 MG IJ SOLR
2.0000 mg | Freq: Once | INTRAMUSCULAR | Status: AC | PRN
  Administered 2021-08-17: 2 mg
  Filled 2021-08-13: qty 2

## 2021-08-13 MED ORDER — DOXERCALCIFEROL 4 MCG/2ML IV SOLN
2.0000 ug | INTRAVENOUS | Status: DC
  Administered 2021-08-14: 2 ug via INTRAVENOUS
  Filled 2021-08-13 (×2): qty 2

## 2021-08-13 MED ORDER — CALCIUM GLUCONATE-NACL 2-0.675 GM/100ML-% IV SOLN
2.0000 g | Freq: Once | INTRAVENOUS | Status: AC
  Administered 2021-08-13: 2000 mg via INTRAVENOUS
  Filled 2021-08-13: qty 100

## 2021-08-13 MED ORDER — PRISMASOL BGK 4/2.5 32-4-2.5 MEQ/L EC SOLN
Status: DC
  Filled 2021-08-13 (×39): qty 5000

## 2021-08-13 NOTE — Progress Notes (Signed)
Pulse Ox having trouble reading, so RN asked RT to draw gas to see where the pt oxygenation status was. RT will continue to monitor as needed.

## 2021-08-13 NOTE — Procedures (Signed)
Bronchoscopy Procedure Note  Adriana Tucker  [id number]  [birth year]  Date:08/13/21  Time:2:22 PM   Provider Performing:Zaccai Chavarin   Procedure(s):  Flexible bronchoscopy with bronchial alveolar lavage (19622)  Indication(s) Diagnostic  Consent Risks of the procedure as well as the alternatives and risks of each were explained to the patient and/or caregiver.  Consent for the procedure was obtained and is signed in the bedside chart  Anesthesia Fentanyl, Propofol, Precedex gtt; RASS goal -4   Time Out Verified patient identification, verified procedure, site/side was marked, verified correct patient position, special equipment/implants available, medications/allergies/relevant history reviewed, required imaging and test results available.   Sterile Technique Usual hand hygiene, masks, gowns, and gloves were used   Procedure Description Bronchoscope advanced through endotracheal tube and into airway.  Airways were examined down to subsegmental level with findings noted below.   Following diagnostic evaluation, BAL(s) performed in RLL with normal saline and return of 30cc fluid  Findings: Clear airways without significant mucus noted 30cc cloudy/serosanguinous fluid obtained   Complications/Tolerance None; patient tolerated the procedure well. Chest X-ray is needed post procedure.   EBL Minimal   Specimen(s) BAL   Procedure performed under direct supervision of Dr Ina Homes

## 2021-08-13 NOTE — Progress Notes (Signed)
Pharmacy Antibiotic Note  Adriana Tucker is a 40 y.o. female admitted on 08/21/2021 with sepsis after being found unresponsive.  Pharmacy has been consulted for vancomycin, cefepime, Eraxis, and doxycycline dosing. ESRD-HD, dry wt may be ~ 52kg. Put on CRRT 11/18 ~1500 and broadened coverage for atypicals and fungal infections since pt was on fluconazole prior to admission and takes cellcept and steroids for lupus.  Plan: Increase cefepime to 2 g IV BID with CRRT Increase vancomycin 750 mg IV q24h with CRRT Start doxycycline 100 mg IV BID Start Eraxis 200 mg IV load, then 100 mg IV q24h  Height: 5\' 1"  (154.9 cm) Weight: 62 kg (136 lb 11 oz) IBW/kg (Calculated) : 47.8  Temp (24hrs), Avg:99.6 F (37.6 C), Min:98.8 F (37.1 C), Max:100.7 F (38.2 C)  Recent Labs  Lab 08/20/2021 0830 08/04/2021 0849 08/09/2021 1100 07/27/2021 2137 08/13/21 0404  WBC 2.0*  --   --   --  1.4*  CREATININE 6.37* 6.80*  --  6.08* 6.27*  LATICACIDVEN 2.7*  --  2.2*  --   --     Estimated Creatinine Clearance: 10 mL/min (A) (by C-G formula based on SCr of 6.27 mg/dL (H)).    No Known Allergies  Antimicrobials this admission: Vancomycin 11/18 >>  Cefepime 11/18 >>  Doxycycline 11/18 >>  Eraxis 11/18 >>   Dose adjustments this admission: CRRT dose adjustments.  Microbiology results: 11/17 BCx: NGTD 11/17 MRSA PCR: not detected 11/18 Sputum: sent   11/18 RSV: sent  11/18 Bronchial lavage cxs: sent   Thank you for allowing pharmacy to be a part of this patient's care.  Varney Daily, PharmD PGY1 Pharmacy Resident  Please check AMION for all [facility] pharmacy phone numbers After 10:00 PM call main pharmacy [phone]

## 2021-08-13 NOTE — Progress Notes (Signed)
[facility] Progress Note  Subjective: seen in ICU, on vent and sedated  Vitals:   08/13/21 0746 08/13/21 0937 08/13/21 1132 08/13/21 1200  BP:    100/65  Pulse:   (!) 113 (!) 122  Resp:   18 17  Temp: 99.5 F (37.5 C)   99 F (37.2 C)  TempSrc: Oral   Axillary  SpO2:  96%  91%  Weight:      Height:        Exam: General: intubated and sedated chronically ill appearing Head: NCAT +sclera icterus  Neck: Supple.  Lungs: on vent, +diffuse rhonchi b/l Heart: RRR. No murmur, rubs or gallops.  Abdomen: soft, nontender, +BS, +ascites Lower extremities: 1+ edema in calves/feet Neuro: unresponsive, sedated  Dialysis Access: [facility] RIJ    Home meds - wellbutrin er, decadron, diflucan, neurontin, percocet prn, lipitor, clonidine 0.4 tid, lasix 80 qd, glipizide, hydralazine 100 tid, toprol xl 100 bid, prns    Summary: hx of SLE, brain aneurysm repair/ CVA's w/ L sided weakness, uses WC, DM2, HTN, ESRD on HD. Here w/ wt loss 50 lbs over 4 mos, difficulty swallowing. No HD issues. Frequent UTI's, home self-cath once daily. New sx's were hacking cough. Found unresponsive and 911 was called.     OP HD: [facility] TTS (transient, usual HD is in [place])   3.5h  300/500  55kg  2/2 bath  R IJ TDC Heparin 3000  - mircera 30 q2, not yet given  - hect 2 ug tiw   Assessment/ Plan: Septic shock /aspiration PNA - getting IV abx, pressors x 2 Acute hypoxic resp failure / ARDS/ asp PNA - per CCM, on vent AMS - was following commands yest, today more sedated ESRD - on HD TTS, transient from [place]. Remains in shock on pressors, plan is to start CRRT today.   Volume - +sig LE edema but d/t low BP cannot pull fluid yet w/ CRRT Anemia CKD - Hb 7- 9, will start darbe 40 weekly, get fe/ tibc/ ferritin  MBD ckd - cont vdra IV w/ HD, no binders while on the vent H/o SLE H/o adrenal insufficiency - getting IV steroids here DM2 - on D10 now     Rob Deanne Bedgood 08/13/2021, 1:24 PM   Recent Labs  Lab  08/02/2021 2137 07/29/2021 2358 08/13/21 0404 08/13/21 0449 08/13/21 0939  K 4.6   < > 4.1 3.9 4.1  BUN 41*  --  43*  --   --   CREATININE 6.08*  --  6.27*  --   --   CALCIUM 7.4*  --  7.8*  --   --   PHOS  --   --  5.3*  --   --   HGB  --    < > 8.7* 7.5* 8.2*   < > = values in this interval not displayed.   Inpatient medications:  chlorhexidine gluconate (MEDLINE KIT)  15 mL Mouth Rinse BID   Chlorhexidine Gluconate Cloth  6 each Topical Daily   docusate  100 mg Per Tube BID   hydrocortisone sod succinate (SOLU-CORTEF) inj  100 mg Intravenous Q12H   mouth rinse  15 mL Mouth Rinse 10 times per day   pantoprazole (PROTONIX) IV  40 mg Intravenous QHS   polyethylene glycol  17 g Per Tube Daily   sodium chloride flush  10-40 mL Intracatheter Q12H   vancomycin variable dose per unstable renal function (pharmacist dosing)   Does not apply See admin instructions  prismasol BGK 4/2.5      prismasol BGK 4/2.5     sodium chloride Stopped (08/10/2021 1146)   anidulafungin     Followed by   anidulafungin     ceFEPime (MAXIPIME) IV Stopped (08/13/21 1129)   dexmedetomidine (PRECEDEX) IV infusion Stopped (08/13/21 1102)   dextrose 25 mL/hr at 08/13/21 0600   fentaNYL infusion INTRAVENOUS 150 mcg/hr (08/13/21 1200)   norepinephrine (LEVOPHED) Adult infusion 27 mcg/min (08/13/21 1200)   norepinephrine Stopped (07/28/2021 1146)   prismasol BGK 4/2.5     propofol (DIPRIVAN) infusion 40 mcg/kg/min (08/13/21 1200)   vasopressin 0.03 Units/min (08/13/21 1319)   sodium chloride, alteplase, docusate, fentaNYL (SUBLIMAZE) injection, heparin, midazolam, norepinephrine, polyethylene glycol, rocuronium, sodium chloride, sodium chloride flush

## 2021-08-13 NOTE — Progress Notes (Signed)
Initial Nutrition Assessment  DOCUMENTATION CODES:   Not applicable  INTERVENTION:   When able to begin tube feeding via OG tube, recommend: Vital 1.5 at 25 ml/h, increase by 10 ml every 8 hours to goal rate of 55 ml/h (1320 ml per day) Prosource TF 45 ml QID  Provides 2140 kcal, 133 gm protein, 1008 ml free water daily  When feedings are started, need to monitor magnesium, potassium, and phosphorus BID for at least 3 days, MD to replete as needed, as pt is at risk for refeeding syndrome given reported severe weight loss.  NUTRITION DIAGNOSIS:   Increased nutrient needs related to chronic illness, acute illness (ESRD, now requiring CRRT) as evidenced by estimated needs.  GOAL:   Patient will meet greater than or equal to 90% of their needs  MONITOR:   TF tolerance, Vent status, Skin, I & O's, Labs  REASON FOR ASSESSMENT:   Ventilator    ASSESSMENT:   40 yo female admitted with AMS. 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 Patient lives in [place] and is visiting her sister in [place].  Patient's sister at bedside reports patient has lost ~60 lbs over the past 3 months. Patient has had reflux and was unable to eat much for the past 3 months. Patient has been seen by nephrology, rheumatology, and PCP in [place] without any diagnosis.   Discussed patient in ICU rounds and with RN today. OG tube in place to LIS with bloody output. Amount of output not recorded. Plans to wait on starting feeding until Saturday.  Patient is being treated for PNA and may need to be proned. Currently requiring CRRT.   Patient is currently intubated on ventilator support MV: 7.5 L/min Temp (24hrs), Avg:99.4 F (37.4 C), Min:98.6 F (37 C), Max:100.7 F (38.2 C)  Propofol: 7.4 ml/hr providing 195 kcal from lipid.   Labs reviewed. Na 132, phos 5.3, mag 1.3 CBG: 100-117-52-86-87  Medications reviewed and include solu-cortef, Protonix, Precedex, mag sulfate, Levophed,  propofol, vasopressin. IVF: D10 at 25 ml/h providing 204 kcal.    No recent weights available for review. Per documented weight readings, she weighed 72.6 kg (160 lbs) ~3 years ago. Per sister, patient weighed 186 lbs 3 months ago, now down to ~127 lbs. That is 32% weight loss, but no documentation to support it. Some weight loss is likely related to negative fluid balance associated with HD. Admission weight 60.5 kg (133 lbs), now up to 62 kg (136 lbs) with edema and fluid retention.  Patient with mild-moderate depletion of muscle mass on BLE. Edema could be masking even further muscle depletion.  NUTRITION - FOCUSED PHYSICAL EXAM:  Flowsheet Row Most Recent Value  Orbital Region No depletion  Upper Arm Region No depletion  Thoracic and Lumbar Region No depletion  Buccal Region Unable to assess  Temple Region No depletion  Clavicle Bone Region No depletion  Clavicle and Acromion Bone Region No depletion  Scapular Bone Region Unable to assess  Dorsal Hand No depletion  Patellar Region Mild depletion  Anterior Thigh Region Mild depletion  Posterior Calf Region Moderate depletion  Edema (RD Assessment) Moderate  Hair Reviewed  Eyes Reviewed  Mouth Unable to assess  Skin Reviewed  Nails Reviewed       Diet Order:   Diet Order             Diet NPO time specified  Diet effective now  EDUCATION NEEDS:   Not appropriate for education at this time  Skin:  Skin Assessment: Reviewed RN Assessment  Last BM:  no BM documented  Height:   Ht Readings from Last 1 Encounters:  08/20/2021 5\' 1"  (1.549 m)    Weight:   Wt Readings from Last 1 Encounters:  08/13/21 62 kg    BMI:  Body mass index is 25.83 kg/m.  Estimated Nutritional Needs:   Kcal:  1900-2200  Protein:  130-145 gm  Fluid:  1 L + UOP    Lucas Mallow, RD, LDN, CNSC Please refer to Amion for contact information.

## 2021-08-13 NOTE — Progress Notes (Signed)
eLink Physician-Brief Progress Note Patient Name: Adriana Tucker DOB: [birth year] MRN: [id number]   Date of Service  08/13/2021  HPI/Events of Note  Patient with sub-optimal sedation on the ventilator.  eICU Interventions  Fentanyl gtt added.        Kerry Kass [name] 08/13/2021, 5:10 AM

## 2021-08-13 NOTE — Progress Notes (Signed)
eLink Physician-Brief Progress Note Patient Name: MELVIA MATOUSEK DOB: 1981-05-03 MRN: [id number]   Date of Service  08/13/2021  HPI/Events of Note  Patient with profound hypoxemia on ABG drawn after pulse oximeter was noted to not be tracking, bedside RN reports symmetrical but diminished breath sounds bilaterally, no wheezes, rhonchi or crackles per RN, CTA  chest yesterday showed bilateral infiltrates and was negative for PE.  eICU Interventions  Stat portable CXR ordered to r/o pneumothorax, FiO2 increased to 100 % and PEEP from 5 to 8 cm pending result of stat portable CXR, if pneumothorax ruled out and patient has worsened  / extensive infiltrates, will increase PEEP further, if CXR does not adequately explain hypoxia will consider repeat CTA chest as patient has a history of PE , with an IVC filter in place.        Kerry Kass Eufemio Strahm 08/13/2021, 1:31 AM

## 2021-08-13 NOTE — Progress Notes (Signed)
  Echocardiogram 2D Echocardiogram has been performed.  Adriana Tucker 08/13/2021, 2:10 PM

## 2021-08-13 NOTE — Progress Notes (Signed)
NAME:  Adriana Tucker, MRN:  [id number], DOB:  [birth year], LOS: 1 ADMISSION DATE:  08/19/2021, CONSULTATION DATE:  11/17 REFERRING MD:  Kommor, EDP, CHIEF COMPLAINT:  AMS   History of Present Illness:  Adriana Tucker is a 40 y.o. female who has a PMH as outlined below including ESRD 2/2 lupus nephritis on HD TTS (last on Tues 11/15)   She resides in [place] and is in town visiting her sister.  She presented to [facility] ED 11/17 with AMS.  Family apparently had trouble waking her up that morning; therefore, EMS was called.  On EMS arrival, she was minimally responsive to noxious stimuli. Per mother, pt had been complaining of chills and always feeling "cold to the bone".  Also developed a cough the night prior to ED presentation.  Otherwise, no complaints and she was in her usual state of health.  She has had about roughly 50-60lbs weight loss in the past 2-3 months.  She has seen nephrology, rheumatology, and PCP back in [facility] without any explanation / diagnosis.   In ED, CT head and CTA head/neck negative for acute process (did show remote known bilateral cerebellar infarcts). CTA chest/abd/pelv neg for aneurysm or dissection, did show probable aspiration PNA and small volume ascites along with cholelithiasis. She was intubated for airway protection and she was found to be profoundly hypotensive; therefore, was started on NE, epi, vaso.  CVL and art line were placed by EDP. PCCM called for admission to ICU. Neuro consulted for assistance.  Pertinent  Medical History   Past Medical History:  Diagnosis Date   Brain aneurysm    CKD (chronic kidney disease)    due to lupus   Hypertension    Lupus ([place])    Significant Hospital Events: Including procedures, antibiotic start and stop dates in addition to other pertinent events   11/17 > admitted to ICU for AMS and shock; started on vasopressors and continued on vent support  Interim History / Subjective:  Overnight, vent settings adjusted for  profound hypoxemia on ABG. This morning, patient continued on full vent support, fentanyl gtt for sedation and vasopressors.   Objective   Blood pressure 114/65, pulse (!) 108, temperature 99.5 F (37.5 C), temperature source Oral, resp. rate (!) 30, height 5\' 1"  (1.549 m), weight 62 kg, SpO2 96 %.    Vent Mode: PRVC FiO2 (%):  [40 %-100 %] 100 % Set Rate:  [18 bmp] 18 bmp Vt Set:  [380 mL] 380 mL PEEP:  [5 cmH20-12 cmH20] 12 cmH20 Pressure Support:  [10 cmH20] 10 cmH20 Plateau Pressure:  [16 cmH20-28 cmH20] 20 cmH20   Intake/Output Summary (Last 24 hours) at 08/13/2021 0955 Last data filed at 08/13/2021 0600 Gross per 24 hour  Intake 2438.32 ml  Output 45 ml  Net 2393.32 ml   Filed Weights   08/01/2021 1527 08/13/21 0545  Weight: 60.5 kg 62 kg    Examination: General: acutely ill middle aged female, intubated HENT: [place]/AT, ETT in place, PERRL, EOMI Lungs: diffuse rhonchi, R>L; bedside US with dense consolidations in RLL  Cardiovascular: Tachycardic, S1 and S2 present, no m/r/g Abdomen: soft, nondistended, +BS Extremities: warm and dry Neuro: somnolent but arousable to voice, following commands, left sided deficits persistent  GU: Foley in place Skin: no rash   Resolved Hospital Problem list    Assessment & Plan:  Septic shock 2/2 aspiration PNA Patient continues to require vasopressor support in setting of aspiration pneumonia. Severe leukopenia in setting of infection. Tmax  100.7.  - Continue vanc and cefepime  - Continue vasopressor support for goal MAP> 65 - Trend CBC and fever curve  Acute hypoxic respiratory failure in setting of ARDS 2/2 aspiration PNA as above CT Chest with extensive bilateral consolidations consistent with aspiration PNA also noted on CXR this AM. Bedside US with dense consolidations in RLL. Patient requiring full vent support. ARDS physiology with A-a gradient >550; P/F ratio 109 - Continue full vent support - VAP prevention - Continue  sedation for goal RASS -4 - May require proning if no improvement in A-a gradient  Acute encephalopathy - improving Patient more awake this morning and following commands. EEG without seizure activity. Encephalopathy likely in setting of sepsis and hypoxia.  - Continue precedex, fentanyl and propofol gtt prn for goal RASS -4 in setting of ARDS as above  ESRD on HD AGMA - likely 2/2 septic shock as above Has been on dialysis for 6 years due to lupus nephritis. Hypomagnesemia, hyperphosphatemia  - Nephrology consulted for dialysis - Plans to start CRRT today - Trend renal function  Anemia of chronic disease  Likely in due to chronic renal disease; Hb stable ~8 - Trend CBC - Transfuse for goal Hb >7  Hx of lupus  Adrenal insufficiency - hx of steroid use - Continue high dose steroids   Hx of DM - currently on D10LR for hypoglycemia  Best Practice (right click and "Reselect all SmartList Selections" daily)   Diet/type: tubefeeds DVT prophylaxis: SCD GI prophylaxis: PPI Lines: Central line and Dialysis Catheter Foley:  Yes, and it is still needed Code Status:  full code Last date of multidisciplinary goals of care discussion [`11/18 - family updated at bedside]  Labs   CBC: Recent Labs  Lab 08/19/2021 0830 08/04/2021 0849 08/21/2021 2358 08/13/21 0231 08/13/21 0404 08/13/21 0449 08/13/21 0939  WBC 2.0*  --   --   --  1.4*  --   --   NEUTROABS 1.4*  --   --   --   --   --   --   HGB 7.7*   < > 11.2* 8.5* 8.7* 7.5* 8.2*  HCT 25.3*   < > 33.0* 25.0* 26.9* 22.0* 24.0*  MCV 92.7  --   --   --  87.1  --   --   PLT 50*  --   --   --  49*  --   --    < > = values in this interval not displayed.    Basic Metabolic Panel: Recent Labs  Lab 08/19/2021 0830 08/13/2021 0849 07/31/2021 1045 08/18/2021 2137 07/30/2021 2358 08/13/21 0231 08/13/21 0404 08/13/21 0449 08/13/21 0939  NA 136 137   < > 129* 132* 131* 131* 126* 132*  K 2.9* 2.8*   < > 4.6 3.7 3.8 4.1 3.9 4.1  CL 102 100  --   100  --   --  100  --   --   CO2 23  --   --  16*  --   --  16*  --   --   GLUCOSE 86 82  --  158*  --   --  124*  --   --   BUN 37* 32*  --  41*  --   --  43*  --   --   CREATININE 6.37* 6.80*  --  6.08*  --   --  6.27*  --   --   CALCIUM 7.4*  --   --  7.4*  --   --  7.8*  --   --   MG  --   --   --   --   --   --  1.3*  --   --   PHOS  --   --   --   --   --   --  5.3*  --   --    < > = values in this interval not displayed.   GFR: Estimated Creatinine Clearance: 10 mL/min (A) (by C-G formula based on SCr of 6.27 mg/dL (H)). Recent Labs  Lab 08/08/2021 0830 08/13/2021 1030 08/18/2021 1100 08/13/21 0404  PROCALCITON  --  37.81  --   --   WBC 2.0*  --   --  1.4*  LATICACIDVEN 2.7*  --  2.2*  --     Liver Function Tests: Recent Labs  Lab 08/11/2021 0830  AST 17  ALT 16  ALKPHOS 33*  BILITOT 1.1  PROT 4.0*  ALBUMIN 2.0*   No results for input(s): LIPASE, AMYLASE in the last 168 hours. Recent Labs  Lab 08/09/2021 0830  AMMONIA 17    ABG    Component Value Date/Time   PHART 7.365 08/13/2021 0939   PCO2ART 30.0 (L) 08/13/2021 0939   PO2ART 164 (H) 08/13/2021 0939   HCO3 17.1 (L) 08/13/2021 0939   TCO2 18 (L) 08/13/2021 0939   ACIDBASEDEF 7.0 (H) 08/13/2021 0939   O2SAT 99.0 08/13/2021 0939     Coagulation Profile: No results for input(s): INR, PROTIME in the last 168 hours.  Cardiac Enzymes: No results for input(s): CKTOTAL, CKMB, CKMBINDEX, TROPONINI in the last 168 hours.  HbA1C: Hgb A1c MFr Bld  Date/Time Value Ref Range Status  12/30/2014 09:58 AM 6.2 (H) 4.8 - 5.6 % Final    Comment:    (NOTE)         Pre-diabetes: 5.7 - 6.4         Diabetes: >6.4         Glycemic control for adults with diabetes: <7.0     CBG: Recent Labs  Lab 08/20/2021 2333 08/13/21 0318 08/13/21 0402 08/13/21 0743 08/13/21 0759  GLUCAP 144* 100* 117* 52* 86   Critical care time: 40 minutes    Harvie Heck, MD Internal Medicine, PGY-3 08/13/21 9:55 AM Pager #  [phone]

## 2021-08-13 NOTE — Progress Notes (Signed)
Subjective: EEG without evidence of seizures, was on multiple drips, so holding off on MRI for now  Exam: Vitals:   08/13/21 1132 08/13/21 1200  BP:  100/65  Pulse: (!) 113 (!) 122  Resp: 18 17  Temp:  99 F (37.2 C)  SpO2:  91%   Gen: In bed, NAD Resp: non-labored breathing, no acute distress Abd: soft, nt  Neuro: MS: Awakens to mild stimulation, follows commands CN: EOMI, fixates and tracks Motor: Moves all extremities to command Sensory: Response to noxious stimulation bilaterally  Pertinent Labs: Sodium 131 Creatinine 6.27 Calcium 7.8 Magnesium 1.3  Impression: 40 year old female with unresponsiveness in the setting of severe hypertension.  Currently the etiology of her presentation is unclear, but my suspicion is low for a primarily neurological presentation.  I think more likely it was her hypotension resulting in a hypoperfusion episode causing her to have decreased responsiveness.  Her improvement is very reassuring.  Recommendations: 1) can hold for MRI for now, once more stable would consider obtaining it at that time. 2)  will continue to follow  Roland Rack, MD [facility] [phone]  If 7pm- 7am, please page neurology on call as listed in [place].

## 2021-08-13 NOTE — Progress Notes (Signed)
eLink Physician-Brief Progress Note Patient Name: ELONA YINGER DOB: 02-13-1981 MRN: [id number]   Date of Service  08/13/2021  HPI/Events of Note  CXR reviewed, no pneumothorax. Bilateral infiltrates not dramatically worse than baseline.  eICU Interventions  Will repeat ABG on current ventilator settings at 3 AM.        Kerry Kass Shantoya Geurts 08/13/2021, 2:18 AM

## 2021-08-13 NOTE — Progress Notes (Signed)
[place] Progress Note Patient Name: FLORIENE JESCHKE DOB: [birth year] MRN: [id number]   Date of Service  08/13/2021  HPI/Events of Note  Mg++ 1.3  eICU Interventions  Magnesium repleted per E-Link electrolyte replacement protocol.        Kerry Kass Ermina Oberman 08/13/2021, 6:28 AM

## 2021-08-13 NOTE — Progress Notes (Signed)
[place] Progress Note Patient Name: Adriana Tucker DOB: 1981/01/15 MRN: [id number]   Date of Service  08/13/2021  HPI/Events of Note  ABG reviewed.  eICU Interventions  PEEP increased to 12, calcium gluconate 2 gm iv bolus ordered for an ionized calcium of 1.06.        Dorismar Chay U Meegan Shanafelt 08/13/2021, 3:44 AM

## 2021-08-14 ENCOUNTER — Encounter (HOSPITAL_COMMUNITY): Payer: Self-pay | Admitting: Pulmonary Disease

## 2021-08-14 ENCOUNTER — Inpatient Hospital Stay (HOSPITAL_COMMUNITY): Payer: Medicare Other

## 2021-08-14 DIAGNOSIS — A419 Sepsis, unspecified organism: Secondary | ICD-10-CM | POA: Diagnosis not present

## 2021-08-14 DIAGNOSIS — I050 Rheumatic mitral stenosis: Secondary | ICD-10-CM

## 2021-08-14 DIAGNOSIS — M3214 Glomerular disease in systemic lupus erythematosus: Secondary | ICD-10-CM

## 2021-08-14 DIAGNOSIS — J80 Acute respiratory distress syndrome: Secondary | ICD-10-CM

## 2021-08-14 DIAGNOSIS — G9340 Encephalopathy, unspecified: Secondary | ICD-10-CM | POA: Diagnosis not present

## 2021-08-14 DIAGNOSIS — J9601 Acute respiratory failure with hypoxia: Secondary | ICD-10-CM | POA: Diagnosis not present

## 2021-08-14 DIAGNOSIS — N186 End stage renal disease: Secondary | ICD-10-CM

## 2021-08-14 DIAGNOSIS — I330 Acute and subacute infective endocarditis: Secondary | ICD-10-CM

## 2021-08-14 DIAGNOSIS — M329 Systemic lupus erythematosus, unspecified: Secondary | ICD-10-CM

## 2021-08-14 LAB — GLUCOSE, CAPILLARY
Glucose-Capillary: 132 mg/dL — ABNORMAL HIGH (ref 70–99)
Glucose-Capillary: 84 mg/dL (ref 70–99)
Glucose-Capillary: 104 mg/dL — ABNORMAL HIGH (ref 70–99)
Glucose-Capillary: 103 mg/dL — ABNORMAL HIGH (ref 70–99)
Glucose-Capillary: 92 mg/dL (ref 70–99)
Glucose-Capillary: 92 mg/dL (ref 70–99)

## 2021-08-14 LAB — POCT I-STAT 7, (LYTES, BLD GAS, ICA,H+H)
Acid-base deficit: 12 mmol/L — ABNORMAL HIGH (ref 0.0–2.0)
Acid-base deficit: 6 mmol/L — ABNORMAL HIGH (ref 0.0–2.0)
Acid-base deficit: 7 mmol/L — ABNORMAL HIGH (ref 0.0–2.0)
Bicarbonate: 13.9 mmol/L — ABNORMAL LOW (ref 20.0–28.0)
Bicarbonate: 19.7 mmol/L — ABNORMAL LOW (ref 20.0–28.0)
Bicarbonate: 19.8 mmol/L — ABNORMAL LOW (ref 20.0–28.0)
Calcium, Ion: 1.06 mmol/L — ABNORMAL LOW (ref 1.15–1.40)
Calcium, Ion: 1.17 mmol/L (ref 1.15–1.40)
Calcium, Ion: 1.26 mmol/L (ref 1.15–1.40)
HCT: 17 % — ABNORMAL LOW (ref 36.0–46.0)
HCT: 22 % — ABNORMAL LOW (ref 36.0–46.0)
HCT: 22 % — ABNORMAL LOW (ref 36.0–46.0)
Hemoglobin: 5.8 g/dL — CL (ref 12.0–15.0)
Hemoglobin: 7.5 g/dL — ABNORMAL LOW (ref 12.0–15.0)
Hemoglobin: 7.5 g/dL — ABNORMAL LOW (ref 12.0–15.0)
O2 Saturation: 89 %
O2 Saturation: 95 %
O2 Saturation: 99 %
Patient temperature: 94.4
Patient temperature: 97.8
Patient temperature: 98.5
Potassium: 4 mmol/L (ref 3.5–5.1)
Potassium: 4.1 mmol/L (ref 3.5–5.1)
Potassium: 4.2 mmol/L (ref 3.5–5.1)
Sodium: 131 mmol/L — ABNORMAL LOW (ref 135–145)
Sodium: 134 mmol/L — ABNORMAL LOW (ref 135–145)
Sodium: 137 mmol/L (ref 135–145)
TCO2: 15 mmol/L — ABNORMAL LOW (ref 22–32)
TCO2: 21 mmol/L — ABNORMAL LOW (ref 22–32)
TCO2: 21 mmol/L — ABNORMAL LOW (ref 22–32)
pCO2 arterial: 31.5 mmHg — ABNORMAL LOW (ref 32.0–48.0)
pCO2 arterial: 37.8 mmHg (ref 32.0–48.0)
pCO2 arterial: 45.1 mmHg (ref 32.0–48.0)
pH, Arterial: 7.249 — ABNORMAL LOW (ref 7.350–7.450)
pH, Arterial: 7.251 — ABNORMAL LOW (ref 7.350–7.450)
pH, Arterial: 7.315 — ABNORMAL LOW (ref 7.350–7.450)
pO2, Arterial: 158 mmHg — ABNORMAL HIGH (ref 83.0–108.0)
pO2, Arterial: 65 mmHg — ABNORMAL LOW (ref 83.0–108.0)
pO2, Arterial: 86 mmHg (ref 83.0–108.0)

## 2021-08-14 LAB — CBC
HCT: 24.2 % — ABNORMAL LOW (ref 36.0–46.0)
Hemoglobin: 7.9 g/dL — ABNORMAL LOW (ref 12.0–15.0)
MCH: 28.9 pg (ref 26.0–34.0)
MCHC: 32.6 g/dL (ref 30.0–36.0)
MCV: 88.6 fL (ref 80.0–100.0)
Platelets: 35 10*3/uL — ABNORMAL LOW (ref 150–400)
RBC: 2.73 MIL/uL — ABNORMAL LOW (ref 3.87–5.11)
RDW: 16.2 % — ABNORMAL HIGH (ref 11.5–15.5)
WBC: 2.5 10*3/uL — ABNORMAL LOW (ref 4.0–10.5)
nRBC: 0 % (ref 0.0–0.2)

## 2021-08-14 LAB — MAGNESIUM: Magnesium: 2.5 mg/dL — ABNORMAL HIGH (ref 1.7–2.4)

## 2021-08-14 LAB — ACID FAST SMEAR (AFB, MYCOBACTERIA): Acid Fast Smear: NEGATIVE

## 2021-08-14 LAB — RENAL FUNCTION PANEL
Albumin: 1.7 g/dL — ABNORMAL LOW (ref 3.5–5.0)
Albumin: 1.7 g/dL — ABNORMAL LOW (ref 3.5–5.0)
Anion gap: 11 (ref 5–15)
Anion gap: 10 (ref 5–15)
BUN: 24 mg/dL — ABNORMAL HIGH (ref 6–20)
BUN: 15 mg/dL (ref 6–20)
CO2: 19 mmol/L — ABNORMAL LOW (ref 22–32)
CO2: 20 mmol/L — ABNORMAL LOW (ref 22–32)
Calcium: 8.4 mg/dL — ABNORMAL LOW (ref 8.9–10.3)
Calcium: 9.1 mg/dL (ref 8.9–10.3)
Chloride: 101 mmol/L (ref 98–111)
Chloride: 104 mmol/L (ref 98–111)
Creatinine, Ser: 3 mg/dL — ABNORMAL HIGH (ref 0.44–1.00)
Creatinine, Ser: 1.72 mg/dL — ABNORMAL HIGH (ref 0.44–1.00)
GFR, Estimated: 19 mL/min — ABNORMAL LOW (ref >60)
GFR, Estimated: 38 mL/min — ABNORMAL LOW (ref >60)
Glucose, Bld: 146 mg/dL — ABNORMAL HIGH (ref 70–99)
Glucose, Bld: 114 mg/dL — ABNORMAL HIGH (ref 70–99)
Phosphorus: 4.4 mg/dL (ref 2.5–4.6)
Phosphorus: 3.4 mg/dL (ref 2.5–4.6)
Potassium: 4.3 mmol/L (ref 3.5–5.1)
Potassium: 4.4 mmol/L (ref 3.5–5.1)
Sodium: 131 mmol/L — ABNORMAL LOW (ref 135–145)
Sodium: 134 mmol/L — ABNORMAL LOW (ref 135–145)

## 2021-08-14 LAB — TRIGLYCERIDES: Triglycerides: 344 mg/dL — ABNORMAL HIGH (ref <150)

## 2021-08-14 LAB — APTT: aPTT: 46 seconds — ABNORMAL HIGH (ref 24–36)

## 2021-08-14 MED ORDER — SODIUM CHLORIDE 0.9 % IV SOLN: INTRAVENOUS | Status: DC

## 2021-08-14 MED ORDER — MIDAZOLAM HCL 2 MG/2ML IJ SOLN: 2.0000 mg | INTRAMUSCULAR | Status: DC | PRN

## 2021-08-14 MED ORDER — MIDAZOLAM-SODIUM CHLORIDE 100-0.9 MG/100ML-% IV SOLN
0.0000 mg/h | INTRAVENOUS | Status: DC
  Administered 2021-08-14: 3 mg/h via INTRAVENOUS
  Administered 2021-08-15: 4 mg/h via INTRAVENOUS
  Administered 2021-08-16: 3 mg/h via INTRAVENOUS
  Filled 2021-08-14 (×3): qty 100

## 2021-08-14 MED ORDER — DEXTROSE-NACL 5-0.9 % IV SOLN: INTRAVENOUS | Status: DC

## 2021-08-14 MED ORDER — DARBEPOETIN ALFA 40 MCG/0.4ML IJ SOSY
40.0000 ug | PREFILLED_SYRINGE | INTRAMUSCULAR | Status: DC
  Administered 2021-08-14: 40 ug via SUBCUTANEOUS
  Filled 2021-08-14: qty 0.4

## 2021-08-14 MED ORDER — MIDAZOLAM BOLUS VIA INFUSION
0.0000 mg | INTRAVENOUS | Status: DC | PRN
  Administered 2021-08-15: 1 mg via INTRAVENOUS
  Filled 2021-08-14: qty 5

## 2021-08-14 MED ORDER — POLYVINYL ALCOHOL 1.4 % OP SOLN
1.0000 [drp] | OPHTHALMIC | Status: DC | PRN
  Administered 2021-08-14 – 2021-08-15 (×4): 1 [drp] via OPHTHALMIC
  Filled 2021-08-14: qty 15

## 2021-08-14 MED ORDER — POLYETHYLENE GLYCOL 3350 17 G PO PACK: 17.0000 g | PACK | Freq: Every day | ORAL | Status: DC

## 2021-08-14 MED ORDER — VITAL 1.5 CAL PO LIQD
1000.0000 mL | ORAL | Status: DC
  Administered 2021-08-14: 1000 mL

## 2021-08-14 MED ORDER — DOCUSATE SODIUM 50 MG/5ML PO LIQD: 100.0000 mg | Freq: Two times a day (BID) | ORAL | Status: DC

## 2021-08-14 NOTE — Progress Notes (Signed)
[facility] Progress Note  Subjective: seen in ICU, on vent and sedated. +1.3 L yest and - 400 cc so far today.   Vitals:   08/14/21 1000 08/14/21 1100 08/14/21 1119 08/14/21 1151  BP:      Pulse: 100  95   Resp: (!) 24 (!) 26 (!) 26   Temp:    (!) 96 F (35.6 C)  TempSrc:    Axillary  SpO2: 100%  100%   Weight:      Height:        Exam: General: intubated and sedated chronically ill appearing Head: NCAT +sclera icterus  Neck: Supple.  Lungs: on vent, bilat rhonchi ant, no wheezing Heart: RRR. No murmur, rubs or gallops.  Abdomen: soft, nontender, +BS, +ascites Lower extremities: 1+ edema LE's/ UE's Neuro: unresponsive, sedated  Dialysis Access: [facility] RIJ    Home meds - wellbutrin er, decadron, diflucan, neurontin, percocet prn, lipitor, clonidine 0.4 tid, lasix 80 qd, glipizide, hydralazine 100 tid, toprol xl 100 bid, prns    Summary: hx of SLE, brain aneurysm repair/ CVA's w/ L sided weakness, uses WC, DM2, HTN, ESRD on HD. Here w/ wt loss 50 lbs over 4 mos, difficulty swallowing. No HD issues. Frequent UTI's, home self-cath once daily. New sx's were hacking cough. Found unresponsive and 911 was called.     OP HD: [facility] TTS (transient, usual HD is in [place])   3.5h  300/500  55kg  2/2 bath  R IJ TDC Heparin 3000  - mircera 30 q2, not yet given  - hect 2 ug tiw   Assessment/ Plan: Septic shock /aspiration PNA - getting IV abx, remains on pressors x 2 Acute hypoxic resp failure / ARDS/ asp PNA - per CCM, on vent AMS - sedated on the vent ESRD - on HD TTS, transient from [place]. CRRT started 11/18, will continue.     Volume - +sig LE edema, pulling up to 50 cc/hr and tolerating.  Anemia CKD - Hb 7- 9, starting darbe 40 weekly. Low iron but cannot get IV Fe d/t very high ferritin > 7500.  MBD ckd - cont vdra IV w/ HD, no binders while on the vent H/o SLE H/o adrenal insufficiency - getting IV steroids here DM2 - on D10 now Abnormal echo - possible vegetation,  will need TEE when more stable     Rob Prisha Hiley 08/14/2021, 12:25 PM   Recent Labs  Lab 08/13/21 0404 08/13/21 0449 08/14/21 0323 08/14/21 0815  K 4.1   < > 4.3 4.1  BUN 43*  --  24*  --   CREATININE 6.27*  --  3.00*  --   CALCIUM 7.8*  --  8.4*  --   PHOS 5.3*  --  4.4  --   HGB 8.7*   < > 7.9* 5.8*   < > = values in this interval not displayed.    Inpatient medications:  chlorhexidine gluconate (MEDLINE KIT)  15 mL Mouth Rinse BID   Chlorhexidine Gluconate Cloth  6 each Topical Daily   darbepoetin (ARANESP) injection - DIALYSIS  40 mcg Intravenous Q Sat-HD   docusate  100 mg Per Tube BID   doxercalciferol  2 mcg Intravenous Q T,Th,Sat-1800   hydrocortisone sod succinate (SOLU-CORTEF) inj  100 mg Intravenous Q12H   mouth rinse  15 mL Mouth Rinse 10 times per day   pantoprazole (PROTONIX) IV  40 mg Intravenous Q12H   polyethylene glycol  17 g Per Tube Daily  sodium chloride flush  10-40 mL Intracatheter Q12H     prismasol BGK 4/2.5 200 mL/hr at 08/14/21 1100    prismasol BGK 4/2.5 400 mL/hr at 08/14/21 0606   sodium chloride Stopped (08/20/2021 1146)   anidulafungin 100 mg (08/14/21 1014)   ceFEPime (MAXIPIME) IV 2 g (08/14/21 0941)   doxycycline (VIBRAMYCIN) IV Stopped (08/14/21 0522)   feeding supplement (VITAL 1.5 CAL)     fentaNYL infusion INTRAVENOUS 200 mcg/hr (08/14/21 1200)   midazolam 3 mg/hr (08/14/21 1200)   norepinephrine (LEVOPHED) Adult infusion 15 mcg/min (08/14/21 1200)   norepinephrine Stopped (08/15/2021 1146)   prismasol BGK 4/2.5 1,750 mL/hr at 08/14/21 1113   vancomycin 750 mg (08/13/21 2304)   vasopressin 0.03 Units/min (08/14/21 1212)   sodium chloride, alteplase, docusate, fentaNYL (SUBLIMAZE) injection, heparin, midazolam, midazolam, midazolam, norepinephrine, polyethylene glycol, rocuronium, sodium chloride, sodium chloride flush

## 2021-08-14 NOTE — Progress Notes (Addendum)
NAME:  Adriana Tucker, MRN:  [id number], DOB:  1980/12/26, LOS: 2 ADMISSION DATE:  08/18/2021, CONSULTATION DATE:  11/17 REFERRING MD:  Kommor, EDP, CHIEF COMPLAINT:  AMS   History of Present Illness:  Adriana Tucker is a 40 y.o. 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  She presented to [facility] ED 11/17 with AMS.  Family apparently had trouble waking her up that morning; therefore, EMS was called.  On EMS arrival, she was minimally responsive to noxious stimuli. Per mother, pt had been complaining of chills and always feeling "cold to the bone".  Also developed a cough the night prior to ED presentation.  Otherwise, no complaints and she was in her usual state of health.  She has had about roughly 50-60lbs weight loss in the past 2-3 months.  She has seen nephrology, rheumatology, and PCP back in [facility] without any explanation / diagnosis.   In ED, CT head and CTA head/neck negative for acute process (did show remote known bilateral cerebellar infarcts). CTA chest/abd/pelv neg for aneurysm or dissection, did show probable aspiration PNA and small volume ascites along with cholelithiasis. She was intubated for airway protection and she was found to be profoundly hypotensive; therefore, was started on NE, epi, vaso.  CVL and art line were placed by EDP. PCCM called for admission to ICU. Neuro consulted for assistance.  Pertinent  Medical History   Past Medical History:  Diagnosis Date   Brain aneurysm    ESRD (end stage renal disease) ([place])    due to lupus   Hypertension    Lupus (HCC)    Pancytopenia (HCC)    Pleuropericarditis    SAH (subarachnoid hemorrhage) ([place])    Significant Hospital Events: Including procedures, antibiotic start and stop dates in addition to other pertinent events   11/17 > admitted to ICU for AMS and shock; started on vasopressors and continued  on vent support 11/18 > CRRT started, bronchoscopy shows GNRs, echocardiogram shows possible valvular vegetation  Interim History / Subjective:  Still requiring ventilator and dual vasopressor support, maintains deep sedation. Nursing trying to take off a little fluid. Bradycardic with precedex, triglycerides high with propfol  Objective   Blood pressure (!) 76/23, pulse 100, temperature 97.8 F (36.6 C), resp. rate (!) 24, height 5\' 1"  (1.549 m), weight 61.8 kg, SpO2 100 %.    Vent Mode: PRVC FiO2 (%):  [50 %-100 %] 50 % Set Rate:  [16 bmp-26 bmp] 16 bmp Vt Set:  [380 mL] 380 mL PEEP:  [12 cmH20-15 cmH20] 12 cmH20 Plateau Pressure:  [22 cmH20-30 cmH20] 28 cmH20   Intake/Output Summary (Last 24 hours) at 08/14/2021 1113 Last data filed at 08/14/2021 1000 Gross per 24 hour  Intake 3368.37 ml  Output 3454 ml  Net -85.63 ml   Filed Weights   08/13/21 0545 08/13/21 2315 08/14/21 0319  Weight: 62 kg 61.8 kg 61.8 kg    Examination: General: acutely ill appearing,  HENT: ETT to vent, periorbital edema Lungs: diminished bilaterally, sounds of mechanical ventilation auscultated Cardiovascular: tachycardic, regular, tunneled HD line Abdomen: soft, nondistended, +BS Extremities: warm and dry Neuro: deeply sedated, left sided hemiparesis, moves all 4 extremities to noxious stim.  GU: Foley in place Skin: no rash   Resolved Hospital Problem list    Assessment & Plan:  LABELLA Tucker is a  40 y.o. woman with history of  Acute hypoxic respiratory failure in setting of ARDS 2/2 aspiration PNA as above CT Chest with extensive bilateral consolidations consistent with aspiration PNA also noted on CXR this AM. Bedside US with dense consolidations in RLL. Patient requiring full vent support. ARDS physiology with P/F ratio 164 this morning.  - continue abx and sedation  Septic shock 2/2 aspiration PNA Patient continues to require vasopressor support in setting of aspiration pneumonia.  Severe leukopenia in setting of infection. Tmax 100.7.  - continue vanc and cefepime. Prelim Resp cx from bronch shows GNR - folllow up results. Not consistent with DAH.  - Continue vasopressor support for goal MAP> 65 - Trend CBC and fever curve  Acute encephalopathy - improving Patient more awake this morning and following commands. EEG without seizure activity. Encephalopathy likely in setting of sepsis and hypoxia.  - Continue fentanyl. Stopping precedex and propofol due to adverse effect. Will start versed with 4 mg/hr max dose for now   ESRD on HD AGMA - likely 2/2 septic shock as above Has been on dialysis for 6 years due to lupus nephritis. Hypomagnesemia, hyperphosphatemia  - Nephrology consulted for dialysis - continue CRRT - Trend renal function  Anemia of chronic disease  Likely in due to chronic renal disease; Hb stable ~8 - Trend CBC - Transfuse for goal Hb >7  Hx of lupus  Adrenal insufficiency - hx of steroid use - Continue high dose steroids   Hx of DM - currently on D10LR for hypoglycemia - will start tube feeds today  Abnormal Echocardiogram - concerning for endocarditis - blood cx negative - continue antibiotics and anti-fungal as ordered - will need TEE once more stable  Best Practice (right click and "Reselect all SmartList Selections" daily)   Diet/type: tubefeeds DVT prophylaxis: SCD GI prophylaxis: PPI Lines: Central line and Dialysis Catheter Foley:  Yes, and it is still needed Code Status:  full code Last date of multidisciplinary goals of care discussion [`11/19 mother updated over the phone, sister at bedside.]  Labs   CBC: Recent Labs  Lab 08/15/2021 0830 08/02/2021 0849 08/13/21 0404 08/13/21 0449 08/13/21 1729 08/13/21 1857 08/14/21 0030 08/14/21 0323 08/14/21 0815  WBC 2.0*  --  1.4*  --   --   --   --  2.5*  --   NEUTROABS 1.4*  --   --   --   --   --   --   --   --   HGB 7.7*   < > 8.7*   < > 10.5* 7.8* 7.5* 7.9* 5.8*  HCT  25.3*   < > 26.9*   < > 31.0* 23.0* 22.0* 24.2* 17.0*  MCV 92.7  --  87.1  --   --   --   --  88.6  --   PLT 50*  --  49*  --   --   --   --  35*  --    < > = values in this interval not displayed.    Basic Metabolic Panel: Recent Labs  Lab 08/15/2021 0830 08/05/2021 0849 08/09/2021 1045 08/15/2021 2137 08/22/2021 2358 08/13/21 0404 08/13/21 0449 08/13/21 1729 08/13/21 1857 08/13/21 1909 08/14/21 0030 08/14/21 0323 08/14/21 0815  NA 136 137   < > 129*   < > 131*   < > 131* 132*  --  131* 131* 137  K 2.9* 2.8*   < > 4.6   < > 4.1   < >  4.2 4.3  --  4.2 4.3 4.1  CL 102 100  --  100  --  100  --   --   --   --   --  101  --   CO2 23  --   --  16*  --  16*  --   --   --   --   --  19*  --   GLUCOSE 86 82  --  158*  --  124*  --   --   --   --   --  146*  --   BUN 37* 32*  --  41*  --  43*  --   --   --   --   --  24*  --   CREATININE 6.37* 6.80*  --  6.08*  --  6.27*  --   --   --   --   --  3.00*  --   CALCIUM 7.4*  --   --  7.4*  --  7.8*  --   --   --   --   --  8.4*  --   MG  --   --   --   --   --  1.3*  --   --   --  2.8*  --  2.5*  --   PHOS  --   --   --   --   --  5.3*  --   --   --   --   --  4.4  --    < > = values in this interval not displayed.   GFR: Estimated Creatinine Clearance: 20.8 mL/min (A) (by C-G formula based on SCr of 3 mg/dL (H)). Recent Labs  Lab 07/29/2021 0830 08/21/2021 1030 08/24/2021 1100 08/13/21 0404 08/14/21 0323  PROCALCITON  --  37.81  --   --   --   WBC 2.0*  --   --  1.4* 2.5*  LATICACIDVEN 2.7*  --  2.2*  --   --     Liver Function Tests: Recent Labs  Lab 07/29/2021 0830 08/14/21 0323  AST 17  --   ALT 16  --   ALKPHOS 33*  --   BILITOT 1.1  --   PROT 4.0*  --   ALBUMIN 2.0* 1.7*   No results for input(s): LIPASE, AMYLASE in the last 168 hours. Recent Labs  Lab 08/24/2021 0830  AMMONIA 17    ABG    Component Value Date/Time   PHART 7.251 (L) 08/14/2021 0815   PCO2ART 31.5 (L) 08/14/2021 0815   PO2ART 86 08/14/2021 0815   HCO3  13.9 (L) 08/14/2021 0815   TCO2 15 (L) 08/14/2021 0815   ACIDBASEDEF 12.0 (H) 08/14/2021 0815   O2SAT 95.0 08/14/2021 0815     Coagulation Profile: No results for input(s): INR, PROTIME in the last 168 hours.  Cardiac Enzymes: No results for input(s): CKTOTAL, CKMB, CKMBINDEX, TROPONINI in the last 168 hours.  HbA1C: Hgb A1c MFr Bld  Date/Time Value Ref Range Status  12/30/2014 09:58 AM 6.2 (H) 4.8 - 5.6 % Final    Comment:    (NOTE)         Pre-diabetes: 5.7 - 6.4         Diabetes: >6.4         Glycemic control for adults with diabetes: <7.0     CBG: Recent Labs  Lab 08/13/21 1531 08/13/21 1915 08/13/21 2320 08/14/21 0329 08/14/21  [place]   Critical care time: 45 minutes    The patient is critically ill due to respiratory failure, renal failure, septic shock.  Critical care was necessary to treat or prevent imminent or life-threatening deterioration.  Critical care was time spent personally by me on the following activities: development of treatment plan with patient and/or surrogate as well as nursing, discussions with consultants, evaluation of patient's response to treatment, examination of patient, obtaining history from patient or surrogate, ordering and performing treatments and interventions, ordering and review of laboratory studies, ordering and review of radiographic studies, pulse oximetry, re-evaluation of patient's condition and participation in multidisciplinary rounds.   Critical Care Time devoted to patient care services described in this note is 45 minutes. This time reflects time of care of this [place] . This critical care time does not reflect separately billable procedures or procedure time, teaching time or supervisory time of PA/NP/Med student/Med Resident etc but could involve care discussion time.       Spero Geralds [facility] 08/14/2021 11:13 AM  Pager: see AMION  If  no response to pager , please call critical care on call (see AMION) until 7pm After 7:00 pm call Elink

## 2021-08-14 NOTE — Plan of Care (Signed)
CHANGE IN DIALYSIS SCHEDULE DUE TO HERAN CAMPAU April 25, 1981 [id number]  Patient dialysis schedule for the week of 08/15/21-08/22/21 varies from their normal schedule due to Thanksgiving Holiday.  Unfortunately due to unforeseen circumstances schedule while admitted to [facility] will be different from outpatient dialysis schedule.  Need to keep in mind for discharge planning.  Both schedules are as follows:  Normal HD schedule: Tuesday Thursday Saturday Inpatient HD schedule at [facility]: Monday, Wednesday, Saturday Outpatient HD schedule: Tuesday, Friday, Sunday  They will resume their normal schedule on 08/23/21.     Jen Mow, PA-C [facility] Pager: [phone]

## 2021-08-14 NOTE — Consult Note (Addendum)
Cardiology Consultation:   Patient ID: Adriana Tucker MRN: [id number]; DOB: 04-05-1981  Admit date: 08/14/2021 Date of Consult: 08/14/2021  PCP:  Adriana Tucker, No   [facility] Providers Cardiologist: New - lives in [place] here to update MD or APP on Care Team, Refresh:1}     Patient Profile:   Adriana Tucker is a 40 y.o. Tucker with a hx of ESRD 2/2 lupus nephritis on HD TTS, prior subarachnoid hemorrhage 2013 due to rupture of cerebral aneurysm s/p repair, HTN, chronic appearing pancytopenia by [facility] labs, pleuropericarditis who is being seen 08/14/2021 for the evaluation of abnormal echo concerning for endocarditis at the request of Dr. Tamala Tucker.  History of Present Illness:   Adriana Tucker resides in [place] and is here visiting her sister. She is intubated therefore history obtained from chart. Rheum note from 06/2021 stated "She has continued to have pleuropericarditis and abdominal pain as symptoms of her lupus flares, usually responsive to pulse steroid. Her pleuropericardial chest pain has become chronic, possibly secondary to scarring, requiring narcotic pain medication for sleep. Subarachnoid hemorrhage with resulting quadriparesis, progression to ESRD and on dialysis for many years. Plans for transplant complicated by recurrent pancytopenia, ascites and other complications." Regarding her SAH, this resulted in L>R weakness requiring motorized wheelchair. There was also noted concern for chronic noncompliance with visits and medication despite extensive counseling at visits as well as involvement in her mother in her ongoing care. Prior echo in [facility] 06/2021 showed EF 67%, tips of the mitral leaflets are thickened with diastolic doming of the anterior leaflet and restricted motion of the posterior leaflet consistent with rheumatic disease, mild MAC, fibrocalcific changes of the aortic valve with normal opening, mild LAE, mild-mod MR, mild TR.  She presented to [facility] 11/17  with AMS, lethargy, minimally responsive to noxious stimuli. She had also been complaining of recent chills and cough and had had a 50-60lb weight loss in the last 2-3 months. She had reportedly seen nephrology, rheumatology, and PCP back in [facility] without any explanation / diagnosis. CT head showed remote infarcts bilateral cerebellar region but no acute pathology. CTA showed no intracranial aneurysm, stenosis or large vessel occlusion (right carotid normal, left carotid small caliber). CTA chest/abd/pelvis showed no aortic dissection, extensive airspace opacities c/w aspiration, thickening of bladder wall suggestive of cystitis, small volume ascites, anasarca and cholelithiasis. She was intubated for airway protection. She has required pressors and stress-dose steroids for septic shock. Thus far she has been managed for septic shock with severe ARDS secondary to suspected aspiration vs CAP. She's being followed by neurology and nephrology. As part of workup she had 2D echo showing EF 60-65%, mild septal LVH, grade 2 DD, severe thickening of the MV leaflets with mobile density on the atrial side of the posterior annulus concerning for endocarditis with mild MR/mild-severe MS, mobile densities on the LVOT and aortic sides of the aortic valve concerning for endocarditis with mild AI/mild AS, also severe TR. Labwork notable for significant pancytopenia (last WBC 2.5, Hgb 7.9, Plt 35 -> in 06/2021 Hgb 9.2/plt 59), hsTroponin 129->49, hypoalbuminemia in the 2 range. Covid/flu/RVP negative. BCx NGTD thus far and bronch specimens pending as well.   Past Medical History:  Diagnosis Date   Brain aneurysm    ESRD (end stage renal disease) ([place])    due to lupus   Hypertension    Lupus ([place])    Pancytopenia (H[facility])    Pleuropericarditis    SAH (subarachnoid hemorrhage) (H[facility])     Past  Surgical History:  Procedure Laterality Date   BACK SURGERY     BRAIN SURGERY     IR REPAIR TUN/NON TUN CV CATH W/O FL  07/05/2017      Home Medications:  Prior to Admission medications   Medication Sig Start Date End Date Taking? Authorizing Provider  ammonium lactate (AMLACTIN) 12 % cream Apply 1 application topically daily as needed for rash or dry skin. 03/17/21  Yes [provider]  buPROPion ER (WELLBUTRIN SR) 100 MG 12 hr tablet Take 100 mg by mouth every other day. 07/19/21  Yes [provider]  dexamethasone (DECADRON) 2 MG tablet Take 4 mg by mouth every other day. 07/19/21  Yes [provider]  fluconazole (DIFLUCAN) 100 MG tablet Take 100 mg by mouth 3 (three) times a week. After dialysis 07/16/21  Yes [provider]  gabapentin (NEURONTIN) 100 MG capsule Take 1 capsule (100 mg total) by mouth 3 (three) times daily. Patient taking differently: Take 100 mg by mouth See admin instructions. 142m in the evening only on dialysis days 01/08/15  Yes MCherene Altes MD  Multiple Vitamins-Minerals (MULTIVITAMIN WITH MINERALS) tablet Take 1 tablet by mouth daily.   Yes [provider]  oxyCODONE-acetaminophen (PERCOCET) 10-325 MG tablet Take 1 tablet by mouth every 8 (eight) hours as needed for pain. 08/05/21  Yes [provider]  atorvastatin (LIPITOR) 40 MG tablet Take 1 tablet (40 mg total) by mouth every evening. Patient not taking: Reported on 07/31/2021 01/08/15   MCherene Altes MD  cloNIDine (CATAPRES) 0.2 MG tablet Take 2 tablets (0.4 mg total) by mouth 3 (three) times daily. Patient not taking: Reported on 08/05/2021 01/08/15   MCherene Altes MD  furosemide (LASIX) 80 MG tablet Take 1 tablet (80 mg total) by mouth daily. Patient not taking: Reported on 07/27/2021 01/08/15   MCherene Altes MD  glipiZIDE (GLUCOTROL) 5 MG tablet Take 0.5 tablets (2.5 mg total) by mouth daily before breakfast. Patient not taking: Reported on 08/21/2021 01/08/15   MCherene Altes MD  hydrALAZINE (APRESOLINE) 100 MG tablet Take 1 tablet (100 mg total) by mouth  every 8 (eight) hours. Patient not taking: Reported on 07/31/2021 01/08/15   MCherene Altes MD  metoprolol succinate (TOPROL-XL) 100 MG 24 hr tablet Take 1 tablet (100 mg total) by mouth 2 (two) times daily. Take with or immediately following a meal. Patient not taking: Reported on 08/08/2021 01/05/15   MCherene Altes MD    Inpatient Medications: Scheduled Meds:  chlorhexidine gluconate (MEDLINE KIT)  15 mL Mouth Rinse BID   Chlorhexidine Gluconate Cloth  6 each Topical Daily   darbepoetin (ARANESP) injection - DIALYSIS  40 mcg Intravenous Q Sat-HD   docusate  100 mg Per Tube BID   doxercalciferol  2 mcg Intravenous Q T,Th,Sat-1800   hydrocortisone sod succinate (SOLU-CORTEF) inj  100 mg Intravenous Q12H   mouth rinse  15 mL Mouth Rinse 10 times per day   pantoprazole (PROTONIX) IV  40 mg Intravenous Q12H   polyethylene glycol  17 g Per Tube Daily   sodium chloride flush  10-40 mL Intracatheter Q12H   Continuous Infusions:   prismasol BGK 4/2.5 200 mL/hr at 08/13/21 1424    prismasol BGK 4/2.5 400 mL/hr at 08/14/21 0606   sodium chloride Stopped (08/21/2021 1146)   anidulafungin     ceFEPime (MAXIPIME) IV 2 g (08/13/21 2209)   dexmedetomidine (PRECEDEX) IV infusion Stopped (08/13/21 1102)   doxycycline (VIBRAMYCIN) IV Stopped (  08/14/21 0522)   fentaNYL infusion INTRAVENOUS 200 mcg/hr (08/14/21 0800)   norepinephrine (LEVOPHED) Adult infusion 15 mcg/min (08/14/21 0800)   norepinephrine Stopped (08/25/2021 1146)   prismasol BGK 4/2.5 1,750 mL/hr at 08/14/21 0800   propofol (DIPRIVAN) infusion 15 mcg/kg/min (08/14/21 0800)   vancomycin 750 mg (08/13/21 2304)   vasopressin 0.03 Units/min (08/14/21 0800)   PRN Meds: sodium chloride, alteplase, docusate, fentaNYL (SUBLIMAZE) injection, heparin, midazolam, norepinephrine, polyethylene glycol, rocuronium, sodium chloride, sodium chloride flush  Allergies:   No Known Allergies  Social History:   Social History   Socioeconomic  History   Marital status: Single    Spouse name: Not on file   Number of children: Not on file   Years of education: Not on file   Highest education level: Not on file  Occupational History   Not on file  Tobacco Use   Smoking status: Former   Smokeless tobacco: Never  Substance and Sexual Activity   Alcohol use: No   Drug use: No   Sexual activity: Not on file  Other Topics Concern   Not on file  Social History Narrative   Not on file   Social Determinants of Health   Financial Resource Strain: Not on file  Food Insecurity: Not on file  Transportation Needs: Not on file  Physical Activity: Not on file  Stress: Not on file  Social Connections: Not on file  Intimate Partner Violence: Not on file    Family History:   Unable to obtain as pt is intubated/sedated  ROS:  Unable to obtain as pt is intubated/sedated  Physical Exam/Data:   Last art line BP 129/73, HR 72  Vitals:   08/14/21 0700 08/14/21 0741 08/14/21 0800 08/14/21 0821  BP: (!) 72/53  (!) 76/23 (!) 76/23  Pulse:    74  Resp: (!) 26  (!) 0 (!) 26  Temp:  (!) 96.7 F (35.9 C) 97.8 F (36.6 C)   TempSrc:  Axillary    SpO2:   99% 97%  Weight:      Height:        Intake/Output Summary (Last 24 hours) at 08/14/2021 0904 Last data filed at 08/14/2021 0800 Gross per 24 hour  Intake 3415.91 ml  Output 3186 ml  Net 229.91 ml   Last 3 Weights 08/14/2021 08/13/2021 08/13/2021  Weight (lbs) 136 lb 3.9 oz 136 lb 3.9 oz 136 lb 11 oz  Weight (kg) 61.8 kg 61.8 kg 62 kg     Body mass index is 25.74 kg/m.  General: AAF, intubated, in no acute distress. Head: Normocephalic, atraumatic,  no xanthomas, nares are without discharge. Neck: Negative for carotid bruits. JVP difficult to assess Lungs: Clear anteriorly without wheezing, rhonchi, rales. Breathing is unlabored on vent Heart: RRR S1 S2 with 2/6 SEM LSB without rubs or gallops.  Abdomen: Soft, non-tender, non-distended with normoactive bowel sounds. No  rebound/guarding. Extremities: No clubbing or cyanosis. No edema. Distal pedal pulses are 2+ and equal bilaterally. Small sporadic macules on bottom of feet, difficult to discern if these are natural melanin pigmentations or Janeway lesions. No osler notes or Janeway lesions on hands. Fingernails have artificial painted nails so unable to assess for splinter hemorrhages Neuro: Sedated, unresponsive Psych:  Unable to obtain as pt is intubated/sedated   EKG:  The EKG was personally reviewed and demonstrates:  sinus tach 118bpm, occasional PACs, probable LAE, RAD, borderline prolonged QT interval with nonspecific STTW changes Telemetry:  Telemetry was personally reviewed and demonstrates:  NSR,  occasional PVCs  Relevant CV Studies: 2D echo 06/2019 [facility] Dr.Pritha Subramanyam performed preliminary interpretation of this study.  Dr. Enzo Bi Hagopian  performed preliminary interpretation of this study.  The mean transmitral gradient = 5.9 mmHg  Mitral valve area by pressure half-time calculation =  1.9 cm2.  This is consistent with mild mitral stenosis.  There is mild pulmonic regurgitation.  There is trace aortic regurgitation.  The aortic valve is trileaflet  There are fibrocalcific changes of the aortic valve with normal opening.  Abnormal tissue Doppler of the mitral annulus is consistent with abnormal  left ventricular relaxation.  A high E:E' ratio (>15) is consistent with elevated left ventricular filling  pressure (>=20 mmHg).  The inferior vena cava is small (<1.5 cm) consistent with right atrial  pressures of <5 mmHg.  Doppler of the interatrial septum is normal.  The left atrium is mildly dilated.  The left ventricle is normal in internal dimension, wall thicknesses and wall  motion.  There are no regional wall motion abnormalities.  Global left ventricular function is normal.  Calculated ejection fraction is 67%.  There is 2+ (mild to moderate) mitral regurgitation.   The tips of the mitral leaflets are thickened with diastolic doming of the  anterior leaflet and restricted motion of the posterior leaflet consistent  with rheumatic disease  The papillary muscle tips are calcified.  Chordae to the mitral leaflets are calcified.  There is mild mitral annular calcification.  There is no pericardial effusion  The right atrium is normal.  Pulsed wave, continuous wave and color Doppler revealed:  The right ventricle is normal in size and function.  There is 1+ (mild) tricuspid regurgitation.  There is no pulmonary hypertension.  The tricuspid and pulmonic valves are normal.  The aortic root is normal for age and body size.  19379 - Echocardiogram, Complete -[id number]  MMode 2D Measurements & Calculations  Doppler Measurements & Calculations  [place] Z-Scores(Vital Signs)  Conclusion  The tips of the mitral leaflets are thickened with diastolic doming of the  anterior leaflet and restricted motion of the posterior leaflet consistent  with rheumatic disease  There is mild mitral annular calcification.  There are fibrocalcific changes of the aortic valve with normal opening.  The left atrium is mildly dilated.  The left ventricle is normal in internal dimension, wall thicknesses and wall  motion.  Global left ventricular function is normal.  The right ventricle is normal in size and function.  There is 2+ (mild to moderate) mitral regurgitation.  There is 1+ (mild) tricuspid regurgitation.  There is no pulmonary hypertension.  Compared to the previous study on 05/10/2018, there is , more mitral  regurgitation, and evidence of high LV filling pressure.  Dr.Pritha Subramanyam performed preliminary interpretation of this study.  Dr. Enzo Bi Hagopian  performed preliminary interpretation of this study.  Laboratory Data:  High Sensitivity Troponin:   Recent Labs  Lab 08/14/2021 0830 07/28/2021 1030  TROPONINIHS 49* 129*     Chemistry Recent Labs  Lab  08/07/2021 2137 08/21/2021 2358 08/13/21 0404 08/13/21 0449 08/13/21 1909 08/14/21 0030 08/14/21 0323 08/14/21 0815  NA 129*   < > 131*   < >  --  131* 131* 137  K 4.6   < > 4.1   < >  --  4.2 4.3 4.1  CL 100  --  100  --   --   --  101  --   CO2 16*  --  16*  --   --   --  19*  --   GLUCOSE 158*  --  124*  --   --   --  146*  --   BUN 41*  --  43*  --   --   --  24*  --   CREATININE 6.08*  --  6.27*  --   --   --  3.00*  --   CALCIUM 7.4*  --  7.8*  --   --   --  8.4*  --   MG  --   --  1.3*  --  2.8*  --  2.5*  --   GFRNONAA 8*  --  8*  --   --   --  19*  --   ANIONGAP 13  --  15  --   --   --  11  --    < > = values in this interval not displayed.    Recent Labs  Lab 07/31/2021 0830 08/14/21 0323  PROT 4.0*  --   ALBUMIN 2.0* 1.7*  AST 17  --   ALT 16  --   ALKPHOS 33*  --   BILITOT 1.1  --    Lipids  Recent Labs  Lab 08/14/21 0323  TRIG 344*    Hematology Recent Labs  Lab 08/08/2021 0830 08/13/2021 0849 08/13/21 0404 08/13/21 0449 08/14/21 0030 08/14/21 0323 08/14/21 0815  WBC 2.0*  --  1.4*  --   --  2.5*  --   RBC 2.73*  --  3.09*  --   --  2.73*  --   HGB 7.7*   < > 8.7*   < > 7.5* 7.9* 5.8*  HCT 25.3*   < > 26.9*   < > 22.0* 24.2* 17.0*  MCV 92.7  --  87.1  --   --  88.6  --   MCH 28.2  --  28.2  --   --  28.9  --   MCHC 30.4  --  32.3  --   --  32.6  --   RDW 15.5  --  15.8*  --   --  16.2*  --   PLT 50*  --  49*  --   --  35*  --    < > = values in this interval not displayed.   Thyroid  Recent Labs  Lab 08/19/2021 1030  TSH 2.400    BNPNo results for input(s): BNP, PROBNP in the last 168 hours.  DDimer No results for input(s): DDIMER in the last 168 hours.   Radiology/Studies:  CT ANGIO HEAD NECK W WO CM  Result Date: 08/13/2021 CLINICAL DATA:  Unresponsive.  History of aneurysm. EXAM: CT ANGIOGRAPHY HEAD AND NECK TECHNIQUE: Multidetector CT imaging of the head and neck was performed using the standard protocol during bolus administration of  intravenous contrast. Multiplanar CT image reconstructions and MIPs were obtained to evaluate the vascular anatomy. Carotid stenosis measurements (when applicable) are obtained utilizing NASCET criteria, using the distal internal carotid diameter as the denominator. CONTRAST:  126m OMNIPAQUE IOHEXOL 350 MG/ML SOLN COMPARISON:  MRI head and MRA head and neck 12/30/2014. CT head 08/07/2021 FINDINGS: CTA NECK FINDINGS Aortic arch: Proximal great vessels widely patent. Bovine branching arch. Right carotid system: Right carotid widely patent. Negative for stenosis or atherosclerotic disease. Left carotid system: Left carotid bifurcation widely patent. There is small caliber of the left internal carotid artery throughout the neck and skull base without focal stenosis or dissection. Review of the prior MRA neck  without contrast 12/30/2014 reveals progression of small caliber left internal carotid artery. This may be due to decreased outflow with hypoplastic left A1 segment. Vertebral arteries: Both vertebral arteries patent to the basilar. Skeleton: Bilateral laminectomy C3 through C7. Surgical clip in the spinal canal on the right at C5-6. This may be a vascular clip. No vascular malformation is identified in this area. Other neck: Patient is intubated. No mass in the neck. Right jugular central venous catheter tip. Upper chest: Mild atelectasis left upper lobe. Right upper lobe clear. Review of the MIP images confirms the above findings CTA HEAD FINDINGS Anterior circulation: Cavernous carotid patent bilaterally. Left cavernous carotid smaller than the right. Hypoplastic but patent left A1 segment. Right A1 segment normal in size. Both anterior cerebral arteries patent. Middle cerebral arteries are patent bilaterally without stenosis or aneurysm. Posterior circulation: Both vertebral arteries patent to the basilar. PICA patent bilaterally. Basilar widely patent. Superior cerebellar and posterior cerebral arteries patent  bilaterally without stenosis. No aneurysm in the posterior circulation. Venous sinuses: Normal venous enhancement Anatomic variants: None Review of the MIP images confirms the above findings IMPRESSION: 1. No intracranial aneurysm. No intracranial stenosis or large vessel occlusion 2. Normal right carotid artery 3. Diffusely small caliber left internal carotid artery without focal stenosis. This may be due to a decreased outflow with hypoplastic left A1 segment. 4. Cervical laminectomy C3 through C7. Surgical vascular clips in the spinal canal on the right at C5-6. No vascular malformation is present in the cervical spine. Electronically Signed   By: Franchot Gallo M.D.   On: 08/10/2021 10:15   CT Head Wo Contrast  Result Date: 07/27/2021 CLINICAL DATA:  Family had trouble waking the patient up this morning, altered mental status EXAM: CT HEAD WITHOUT CONTRAST TECHNIQUE: Contiguous axial images were obtained from the base of the skull through the vertex without intravenous contrast. COMPARISON:  None. FINDINGS: Brain: There is no evidence of acute intracranial hemorrhage, extra-axial fluid collection, or acute infarct. There are remote infarcts in the bilateral cerebellar hemispheres, left larger than right. There is mild global parenchymal volume loss, greater than expected for age. The ventricles are not enlarged. There is no solid mass lesion. There is no midline shift. An enlarged empty sella is noted, likely incidental. Vascular: No hyperdense vessel or unexpected calcification. Skull: Normal. Negative for fracture or focal lesion. Sinuses/Orbits: There are secretions in the imaged nasopharynx. The imaged paranasal sinuses are clear. The globes and orbits are unremarkable. Other: There is advanced degenerative change of the temporomandibular joints. IMPRESSION: 1. No acute intracranial pathology. 2. 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 3. Mild global parenchymal  volume loss, slightly greater than expected for age. Electronically Signed   By: Valetta Mole M.D.   On: 08/06/2021 09:23   DG Chest Port 1 View  Result Date: 08/13/2021 CLINICAL DATA:  Acute respiratory failure EXAM: PORTABLE CHEST 1 VIEW COMPARISON:  07/31/2021 FINDINGS: Cardiac shadow is stable. Endotracheal tube and gastric catheter are again noted and stable. Right jugular dialysis catheter is noted extending into the right atrium. Lungs are well aerated bilaterally with bibasilar airspace opacities and right-sided pleural effusion. The overall appearance is stable from the prior study. IMPRESSION: Stable appearance when compared with the previous exam. No new acute abnormality is noted. Electronically Signed   By: Inez Catalina M.D.   On: 08/13/2021 01:59   Portable Chest x-ray  Result Date: 08/06/2021 CLINICAL DATA:  Endotracheal tube present Z97.8 (ICD-10-CM) EXAM:  PORTABLE CHEST 1 VIEW COMPARISON:  Same day CT chest. FINDINGS: Approximately 3 cm above the carina. Gastric tube courses below the diaphragm with the tip outside the field of view. Large bore right central venous catheter with the tip in the inferior right atrium. Right greater left bibasilar opacities. No visible pneumothorax. Small right pleural effusion. Enlarged cardiac silhouette. IMPRESSION: 1. Endotracheal tube tip approximately 3 cm above the carina. 2. Large bore right central venous catheter with the tip in the inferior right atrium. 3. Right greater than left bibasilar opacities, further characterized on same day CT chest. 4. Small right pleural effusion. Electronically Signed   By: Margaretha Sheffield M.D.   On: 08/16/2021 15:55   DG Abd Portable 1V  Result Date: 08/13/2021 CLINICAL DATA:  Check gastric catheter placement EXAM: PORTABLE ABDOMEN - 1 VIEW COMPARISON:  None. FINDINGS: Gastric catheter is noted within the stomach. Dialysis catheter is noted extending into the right atrium. Right-sided effusion is noted as well  as left retrocardiac infiltrate. IVC filter is noted in place. Right femoral central line is noted extending towards the IVC filter. IMPRESSION: Gastric catheter as described. Electronically Signed   By: Inez Catalina M.D.   On: 08/11/2021 22:10   EEG adult  Result Date: 08/04/2021 Lora Havens, MD     08/11/2021 12:31 PM Patient Name: Adriana Tucker MRN: [id number] Epilepsy Attending: Lora Havens Referring Physician/Provider: Dr. Kathrynn Speed Date: 07/29/2021 Duration: 22.09 mins Patient history: 40 year old Tucker with altered mental status.  EEG to evaluate for seizure. Level of alertness: Awake AEDs during EEG study: Gabapentin Technical aspects: This EEG study was done with scalp electrodes positioned according to the 10-20 International system of electrode placement. Electrical activity was acquired at a sampling rate of _0  and reviewed with a high frequency filter of _1  and a low frequency filter of _2 . EEG data were recorded continuously and digitally stored. Description: The posterior dominant rhythm consists of 8 Hz activity of moderate voltage (25-35 uV) seen predominantly in posterior head regions, symmetric and reactive to eye opening and eye closing. Hyperventilation and photic stimulation were not performed.   IMPRESSION: This study is within normal limits. No seizures or epileptiform discharges were seen throughout the recording. Lora Havens   ECHOCARDIOGRAM COMPLETE  Result Date: 08/13/2021    ECHOCARDIOGRAM REPORT   Patient Name:   Adriana Tucker Date of Exam: 08/13/2021 Medical Rec #:  [id number]        Height:       61.0 in Accession #:    [id number]       Weight:       136.7 lb Date of Birth:  1981/07/21       BSA:          1.607 m Patient Age:    87 years         BP:           100/65 mmHg Patient Gender: F                HR:           105 bpm. Exam Location:  Inpatient Procedure: 2D Echo, 3D Echo, Cardiac Doppler and Color Doppler Indications:    I50.40*  Unspecified combined systolic (congestive) and diastolic                 (congestive) heart failure  History:        Patient has no prior history of Echocardiogram examinations.  Stroke; Risk Factors:Hypertension, Diabetes and Dyslipidemia.                 ESRD. Dialysis. Shock. Lupus.  Sonographer:    Roseanna Rainbow RDCS Referring Phys: [id number] [place]  1. Severe thickening and calcification of the mitral valve suggests chronic calcification. However, there are mobile densities on the mitral and aortic valves, raising concern for endocardits. Recommed cardiology consultation and TEE to better evaluate.  2. Left ventricular ejection fraction, by estimation, is 60 to 65%. The left ventricle has normal function. The left ventricle has no regional wall motion abnormalities. There is mild left ventricular hypertrophy of the septal segment. Left ventricular diastolic parameters are consistent with Grade II diastolic dysfunction (pseudonormalization). Elevated left atrial pressure.  3. Right ventricular systolic function is normal. The right ventricular size is normal. There is normal pulmonary artery systolic pressure.  4. Severe thickening of the mitral valve leaflets. There is a mobile density on the atrial side of the posterior annulus. Findings are concerning for endocarditis. The mitral valve is abnormal. Mild mitral valve regurgitation. Moderate to severe mitral stenosis.  5. Tricuspid valve regurgitation is severe.  6. Mobile densities noted on the LVOT and aortic sides of the aortic valve. Concerning for endocarditis. The aortic valve is abnormal. Aortic valve regurgitation is mild. Mild aortic valve stenosis. Aortic valve mean gradient measures 11.0 mmHg. Aortic valve Vmax measures 2.23 m/s.  7. The inferior vena cava is normal in size with greater than 50% respiratory variability, suggesting right atrial pressure of 3 mmHg. FINDINGS  Left Ventricle: Left ventricular ejection  fraction, by estimation, is 60 to 65%. The left ventricle has normal function. The left ventricle has no regional wall motion abnormalities. The left ventricular internal cavity size was normal in size. There is  mild left ventricular hypertrophy of the septal segment. Left ventricular diastolic parameters are consistent with Grade II diastolic dysfunction (pseudonormalization). Elevated left atrial pressure. Right Ventricle: The right ventricular size is normal. No increase in right ventricular wall thickness. Right ventricular systolic function is normal. There is normal pulmonary artery systolic pressure. The tricuspid regurgitant velocity is 2.06 m/s, and  with an assumed right atrial pressure of 3 mmHg, the estimated right ventricular systolic pressure is 20.9 mmHg. Left Atrium: Left atrial size was normal in size. Right Atrium: Right atrial size was normal in size. Pericardium: There is no evidence of pericardial effusion. Mitral Valve: Severe thickening of the mitral valve leaflets. There is a mobile density on the atrial side of the posterior annulus. Findings are concerning for endocarditis. The mitral valve is abnormal. There is severe thickening of the mitral valve leaflet(s). Mild mitral valve regurgitation. Moderate to severe mitral valve stenosis. MV peak gradient, 18.3 mmHg. The mean mitral valve gradient is 7.0 mmHg with average heart rate of 105 bpm. Tricuspid Valve: The tricuspid valve is normal in structure. Tricuspid valve regurgitation is severe. No evidence of tricuspid stenosis. Aortic Valve: Mobile densities noted on the LVOT and aortic sides of the aortic valve. Concerning for endocarditis. The aortic valve is abnormal. Aortic valve regurgitation is mild. Mild aortic stenosis is present. Aortic valve mean gradient measures 11.0 mmHg. Aortic valve peak gradient measures 20.0 mmHg. Pulmonic Valve: The pulmonic valve was normal in structure. Pulmonic valve regurgitation is not visualized. No  evidence of pulmonic stenosis. Aorta: The aortic root is normal in size and structure. Venous: IVC assessment for right atrial pressure unable to be performed due to mechanical ventilation. The inferior  vena cava is normal in size with greater than 50% respiratory variability, suggesting right atrial pressure of 3 mmHg. IAS/Shunts: No atrial level shunt detected by color flow Doppler. Additional Comments: A device lead is visualized.  LEFT VENTRICLE PLAX 2D LVIDd:         3.20 cm     Diastology LVIDs:         2.30 cm     LV e' medial:    7.07 cm/s LV PW:         1.00 cm     LV E/e' medial:  23.5 LV IVS:        1.30 cm     LV e' lateral:   5.66 cm/s                            LV E/e' lateral: 29.3  LV Volumes (MOD) LV vol d, MOD A2C: 58.6 ml LV vol d, MOD A4C: 48.7 ml LV vol s, MOD A2C: 26.6 ml LV vol s, MOD A4C: 22.2 ml LV SV MOD A2C:     32.0 ml LV SV MOD A4C:     48.7 ml LV SV MOD BP:      29.8 ml RIGHT VENTRICLE            IVC RV S prime:     9.00 cm/s  IVC diam: 1.75 cm TAPSE (M-mode): 1.3 cm LEFT ATRIUM             Index        RIGHT ATRIUM          Index LA diam:        3.60 cm 2.24 cm/m   RA Area:     8.44 cm LA Vol (A2C):   32.3 ml 20.10 ml/m  RA Volume:   14.50 ml 9.03 ml/m LA Vol (A4C):   29.4 ml 18.30 ml/m LA Biplane Vol: 34.1 ml 21.22 ml/m  AORTIC VALVE AV Vmax:           223.33 cm/s AV Vmean:          154.667 cm/s AV VTI:            0.313 m AV Peak Grad:      20.0 mmHg AV Mean Grad:      11.0 mmHg LVOT Vmax:         202.00 cm/s LVOT Vmean:        138.000 cm/s LVOT VTI:          0.266 m LVOT/AV VTI ratio: 0.85  AORTA Ao Root diam: 2.60 cm Ao Asc diam:  2.60 cm MITRAL VALVE                  TRICUSPID VALVE MV Area (PHT): 3.85 cm       TR Peak grad:   17.0 mmHg MV Peak grad:  18.3 mmHg      TR Vmax:        206.00 cm/s MV Mean grad:  7.0 mmHg MV Vmax:       2.14 m/s       SHUNTS MV Vmean:      121.0 cm/s     Systemic VTI: 0.27 m MV Decel Time: 197 msec MR Peak grad:    45.2 mmHg MR Mean grad:    22.0  mmHg MR Vmax:         336.00 cm/s MR Vmean:  214.0 cm/s MR PISA:         1.57 cm MR PISA Eff ROA: 19 mm MR PISA Radius:  0.50 cm MV E velocity: 166.00 cm/s MV A velocity: 154.00 cm/s MV E/A ratio:  1.08 Skeet Latch MD Electronically signed by Skeet Latch MD Signature Date/Time: 08/13/2021/6:05:09 PM    Final    CT Angio Chest/Abd/Pel for Dissection W and/or Wo Contrast  Result Date: 08/08/2021 CLINICAL DATA:  Chest pain, aortic dissection suspected, difficulty arousing this morning EXAM: CT ANGIOGRAPHY CHEST, ABDOMEN AND PELVIS TECHNIQUE: Non-contrast CT of the chest was initially obtained. Multidetector CT imaging through the chest, abdomen and pelvis was performed using the standard protocol during bolus administration of intravenous contrast. Multiplanar reconstructed images and MIPs were obtained and reviewed to evaluate the vascular anatomy. CONTRAST:  135m OMNIPAQUE IOHEXOL 350 MG/ML SOLN COMPARISON:  None. FINDINGS: CTA CHEST FINDINGS Cardiovascular: Preferential opacification of the thoracic aorta. Normal contour and caliber of the thoracic aorta. No evidence of aneurysm, dissection, or other acute aortic pathology. Normal heart size. No pericardial effusion. Large-bore right neck vascular catheter. Mediastinum/Nodes: No enlarged mediastinal, hilar, or axillary lymph nodes. Thyroid gland, trachea, and esophagus demonstrate no significant findings. Lungs/Pleura: Endotracheal intubation, endotracheal tube tip just above the ostium of the right mainstem bronchus (series 11, image 54). Very extensive bilateral heterogeneous and consolidative airspace opacity throughout the dependent lungs. Trace bilateral pleural effusions. Musculoskeletal: No chest wall abnormality. No acute or significant osseous findings. Review of the MIP images confirms the above findings. CTA ABDOMEN AND PELVIS FINDINGS VASCULAR Normal contour and caliber of the abdominal aorta. No evidence of aneurysm, dissection,  or other acute aortic pathology. Standard branching pattern of the aorta with solitary bilateral renal arteries. No significant atherosclerosis. Infrarenal IVC filter. Review of the MIP images confirms the above findings. NON-VASCULAR Hepatobiliary: No solid liver abnormality is seen. Gallstones. No gallbladder wall thickening, or biliary dilatation. Pancreas: Unremarkable. No pancreatic ductal dilatation or surrounding inflammatory changes. Spleen: Normal in size without significant abnormality. Adrenals/Urinary Tract: Adrenal glands are unremarkable. Kidneys are normal, without renal calculi, solid lesion, or hydronephrosis. Thickening and fat stranding of the urinary bladder wall (series 16, image 74). Stomach/Bowel: Stomach is within normal limits. Appendix appears normal. No evidence of bowel wall thickening, distention, or inflammatory changes. Lymphatic: No enlarged abdominal or pelvic lymph nodes. Reproductive: No mass or other significant abnormality. Other: No abdominal wall hernia. Anasarca. Small volume ascites throughout the abdomen and pelvis. Musculoskeletal: No acute or significant osseous findings. Renal osteodystrophy. Review of the MIP images confirms the above findings. IMPRESSION: 1. Normal contour and caliber of the thoracic and abdominal aorta. No evidence of aneurysm, dissection, or other acute aortic pathology. No significant atherosclerosis. 2. Endotracheal intubation, endotracheal tube tip just above the of the right mainstem bronchus. Recommend slight retraction. 3. Very extensive bilateral heterogeneous and consolidative airspace opacity throughout the dependent lungs, most consistent with aspiration. 4. Thickening and fat stranding of the urinary bladder wall, suggestive of cystitis. Correlate with urinalysis. 5. Small volume ascites and anasarca. 6. Cholelithiasis. Electronically Signed   By: ADelanna AhmadiM.D.   On: 08/07/2021 10:09     Assessment and Plan:   1. Septic shock with  presentation of ARDS, acute encephalopathy, aspiration vs CAP - in the context of known ESRD on HD, lupus, hx SAH/cerebral aneurysm, pancytopenia, immunocompromised on chronic steroids - primary management by PCCM - on pressors, abx - neurology is also following - they have low suspicion that unresponsiveness was due  to primary neurologic issue; holding MRI for now until more stable  2. 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 Elevated troponin (peak 129) - minimally elevated in context of multiple metabolic stressors, not felt to be of any clinical significance of this time - likely demand ischemia - echo with normal LVEF  4. ESRD on HD - nephrology on board   Risk Assessment/Risk Scores:     N/A   For questions or updates, please contact [place] Please consult [email or web address] for contact info under  Signed, Charlie Pitter, PA-C  08/14/2021 9:04 AM As above, patient seen and examined.  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  Patient is presently intubated and unresponsive.  She apparently is from [place] and is here visiting family.  History is gleaned from the chart and from discussions with her sister.  Patient has had previous subarachnoid  hemorrhage with resulting quadriparesis.  There has been discussion about transplant for her renal dysfunction but she has had problems with recurrent pancytopenia and compliance.  Patient is in the area visiting her mother.  Per her sister she has had recent 36 to 60 pound weight loss over the past several months.  She developed a cough on Wednesday and then was found Thursday with altered mental status.  She was admitted and intubated and is requiring pressors as well as stress dose steroids for septic shock.  Echocardiogram was performed which was abnormal and cardiology asked to evaluate.  Creatinine 3.0 and BUN 24.  White blood cell count 2.5 with hemoglobin 7.9 and platelet count 35,000.  Electrocardiogram shows sinus tachycardia with right axis deviation.  Echocardiogram showed normal LV function, mild left ventricular hypertrophy, grade 2 diastolic dysfunction, severely thickened/calcified mitral valve with question mobile densities and calcified aortic valve with possible densities and endocarditis felt to be possible.  Note there was moderate to severe mitral stenosis, mild mitral regurgitation, severe tricuspid regurgitation, mild aortic insufficiency and mild aortic stenosis with mean gradient 11 mmHg.  Chest CT showed extensive bilateral opacities suggestive of aspiration.  1 abnormal echocardiogram-patient found to have mitral stenosis/mitral regurgitation and aortic stenosis/aortic insufficiency on echocardiogram.  There is a question of vegetations.  I have personally reviewed this echocardiogram.  Ultimately she will need a transesophageal echocardiogram but given multiple ongoing issues including septic shock and pancytopenia we will defer this for now.  Would continue antibiotics.  She clearly would not be a surgical candidate at present if valve replacement were ever required.  2 VDRF/aspiration pneumonia/septic shock-continue antibiotics.  Management per critical care medicine.  3  end-stage renal disease-dialysis dependent.  Nephrology following.   Kirk Ruths, MD

## 2021-08-15 DIAGNOSIS — I050 Rheumatic mitral stenosis: Secondary | ICD-10-CM | POA: Diagnosis not present

## 2021-08-15 DIAGNOSIS — A419 Sepsis, unspecified organism: Secondary | ICD-10-CM | POA: Diagnosis not present

## 2021-08-15 DIAGNOSIS — J9602 Acute respiratory failure with hypercapnia: Secondary | ICD-10-CM

## 2021-08-15 DIAGNOSIS — J151 Pneumonia due to Pseudomonas: Secondary | ICD-10-CM | POA: Diagnosis not present

## 2021-08-15 DIAGNOSIS — J9601 Acute respiratory failure with hypoxia: Secondary | ICD-10-CM | POA: Diagnosis not present

## 2021-08-15 DIAGNOSIS — G9340 Encephalopathy, unspecified: Secondary | ICD-10-CM | POA: Diagnosis not present

## 2021-08-15 LAB — RENAL FUNCTION PANEL
Albumin: 1.7 g/dL — ABNORMAL LOW (ref 3.5–5.0)
Albumin: 1.6 g/dL — ABNORMAL LOW (ref 3.5–5.0)
Anion gap: 12 (ref 5–15)
Anion gap: 10 (ref 5–15)
BUN: 11 mg/dL (ref 6–20)
BUN: 9 mg/dL (ref 6–20)
CO2: 21 mmol/L — ABNORMAL LOW (ref 22–32)
CO2: 22 mmol/L (ref 22–32)
Calcium: 10 mg/dL (ref 8.9–10.3)
Calcium: 10.3 mg/dL (ref 8.9–10.3)
Chloride: 103 mmol/L (ref 98–111)
Chloride: 105 mmol/L (ref 98–111)
Creatinine, Ser: 1.29 mg/dL — ABNORMAL HIGH (ref 0.44–1.00)
Creatinine, Ser: 1.05 mg/dL — ABNORMAL HIGH (ref 0.44–1.00)
GFR, Estimated: 53 mL/min — ABNORMAL LOW (ref >60)
GFR, Estimated: >60 mL/min (ref >60)
Glucose, Bld: 73 mg/dL (ref 70–99)
Glucose, Bld: 146 mg/dL — ABNORMAL HIGH (ref 70–99)
Phosphorus: 3.3 mg/dL (ref 2.5–4.6)
Phosphorus: 2.1 mg/dL — ABNORMAL LOW (ref 2.5–4.6)
Potassium: 4.3 mmol/L (ref 3.5–5.1)
Potassium: 3.8 mmol/L (ref 3.5–5.1)
Sodium: 136 mmol/L (ref 135–145)
Sodium: 137 mmol/L (ref 135–145)

## 2021-08-15 LAB — POCT I-STAT 7, (LYTES, BLD GAS, ICA,H+H)
Acid-base deficit: 5 mmol/L — ABNORMAL HIGH (ref 0.0–2.0)
Acid-base deficit: 6 mmol/L — ABNORMAL HIGH (ref 0.0–2.0)
Acid-base deficit: 6 mmol/L — ABNORMAL HIGH (ref 0.0–2.0)
Acid-base deficit: 7 mmol/L — ABNORMAL HIGH (ref 0.0–2.0)
Bicarbonate: 20.8 mmol/L (ref 20.0–28.0)
Bicarbonate: 21.3 mmol/L (ref 20.0–28.0)
Bicarbonate: 21.6 mmol/L (ref 20.0–28.0)
Bicarbonate: 22.1 mmol/L (ref 20.0–28.0)
Calcium, Ion: 1.42 mmol/L — ABNORMAL HIGH (ref 1.15–1.40)
Calcium, Ion: 1.45 mmol/L — ABNORMAL HIGH (ref 1.15–1.40)
Calcium, Ion: 1.47 mmol/L — ABNORMAL HIGH (ref 1.15–1.40)
Calcium, Ion: 1.5 mmol/L — ABNORMAL HIGH (ref 1.15–1.40)
HCT: 22 % — ABNORMAL LOW (ref 36.0–46.0)
HCT: 22 % — ABNORMAL LOW (ref 36.0–46.0)
HCT: 23 % — ABNORMAL LOW (ref 36.0–46.0)
HCT: 23 % — ABNORMAL LOW (ref 36.0–46.0)
Hemoglobin: 7.5 g/dL — ABNORMAL LOW (ref 12.0–15.0)
Hemoglobin: 7.5 g/dL — ABNORMAL LOW (ref 12.0–15.0)
Hemoglobin: 7.8 g/dL — ABNORMAL LOW (ref 12.0–15.0)
Hemoglobin: 7.8 g/dL — ABNORMAL LOW (ref 12.0–15.0)
O2 Saturation: 88 %
O2 Saturation: 89 %
O2 Saturation: 89 %
O2 Saturation: 98 %
Patient temperature: 95.9
Patient temperature: 97.3
Patient temperature: 97.4
Potassium: 3.6 mmol/L (ref 3.5–5.1)
Potassium: 3.9 mmol/L (ref 3.5–5.1)
Potassium: 4.2 mmol/L (ref 3.5–5.1)
Potassium: 4.3 mmol/L (ref 3.5–5.1)
Sodium: 135 mmol/L (ref 135–145)
Sodium: 135 mmol/L (ref 135–145)
Sodium: 136 mmol/L (ref 135–145)
Sodium: 139 mmol/L (ref 135–145)
TCO2: 22 mmol/L (ref 22–32)
TCO2: 23 mmol/L (ref 22–32)
TCO2: 23 mmol/L (ref 22–32)
TCO2: 24 mmol/L (ref 22–32)
pCO2 arterial: 48 mmHg (ref 32.0–48.0)
pCO2 arterial: 48 mmHg (ref 32.0–48.0)
pCO2 arterial: 49 mmHg — ABNORMAL HIGH (ref 32.0–48.0)
pCO2 arterial: 52.5 mmHg — ABNORMAL HIGH (ref 32.0–48.0)
pH, Arterial: 7.223 — ABNORMAL LOW (ref 7.350–7.450)
pH, Arterial: 7.237 — ABNORMAL LOW (ref 7.350–7.450)
pH, Arterial: 7.252 — ABNORMAL LOW (ref 7.350–7.450)
pH, Arterial: 7.259 — ABNORMAL LOW (ref 7.350–7.450)
pO2, Arterial: 125 mmHg — ABNORMAL HIGH (ref 83.0–108.0)
pO2, Arterial: 62 mmHg — ABNORMAL LOW (ref 83.0–108.0)
pO2, Arterial: 64 mmHg — ABNORMAL LOW (ref 83.0–108.0)
pO2, Arterial: 66 mmHg — ABNORMAL LOW (ref 83.0–108.0)

## 2021-08-15 LAB — CULTURE, RESPIRATORY W GRAM STAIN

## 2021-08-15 LAB — CBC
HCT: 25.2 % — ABNORMAL LOW (ref 36.0–46.0)
Hemoglobin: 7.9 g/dL — ABNORMAL LOW (ref 12.0–15.0)
MCH: 28.3 pg (ref 26.0–34.0)
MCHC: 31.3 g/dL (ref 30.0–36.0)
MCV: 90.3 fL (ref 80.0–100.0)
Platelets: 14 10*3/uL — CL (ref 150–400)
RBC: 2.79 MIL/uL — ABNORMAL LOW (ref 3.87–5.11)
RDW: 16.5 % — ABNORMAL HIGH (ref 11.5–15.5)
WBC: 0.8 10*3/uL — CL (ref 4.0–10.5)
nRBC: 3.9 % — ABNORMAL HIGH (ref 0.0–0.2)

## 2021-08-15 LAB — GLUCOSE, CAPILLARY
Glucose-Capillary: 119 mg/dL — ABNORMAL HIGH (ref 70–99)
Glucose-Capillary: 69 mg/dL — ABNORMAL LOW (ref 70–99)
Glucose-Capillary: 72 mg/dL (ref 70–99)
Glucose-Capillary: 97 mg/dL (ref 70–99)
Glucose-Capillary: 110 mg/dL — ABNORMAL HIGH (ref 70–99)
Glucose-Capillary: 71 mg/dL (ref 70–99)
Glucose-Capillary: 123 mg/dL — ABNORMAL HIGH (ref 70–99)
Glucose-Capillary: 137 mg/dL — ABNORMAL HIGH (ref 70–99)
Glucose-Capillary: 146 mg/dL — ABNORMAL HIGH (ref 70–99)

## 2021-08-15 LAB — APTT: aPTT: 42 seconds — ABNORMAL HIGH (ref 24–36)

## 2021-08-15 LAB — MAGNESIUM: Magnesium: 2.4 mg/dL (ref 1.7–2.4)

## 2021-08-15 MED ORDER — FENTANYL 2500MCG IN NS 250ML (10MCG/ML) PREMIX INFUSION
0.0000 ug/h | INTRAVENOUS | Status: DC
  Administered 2021-08-15 – 2021-08-16 (×2): 250 ug/h via INTRAVENOUS
  Administered 2021-08-16: 200 ug/h via INTRAVENOUS
  Filled 2021-08-15 (×3): qty 250

## 2021-08-15 MED ORDER — TBO-FILGRASTIM 300 MCG/0.5ML ~~LOC~~ SOSY
300.0000 ug | PREFILLED_SYRINGE | Freq: Once | SUBCUTANEOUS | Status: DC
  Filled 2021-08-15: qty 0.5

## 2021-08-15 MED ORDER — DARBEPOETIN ALFA 40 MCG/0.4ML IJ SOSY: 40.0000 ug | PREFILLED_SYRINGE | INTRAMUSCULAR | Status: DC

## 2021-08-15 MED ORDER — DEXTROSE 50 % IV SOLN
INTRAVENOUS | Status: AC
  Administered 2021-08-15: 25 mL
  Filled 2021-08-15: qty 50

## 2021-08-15 MED ORDER — VITAL 1.5 CAL PO LIQD
1000.0000 mL | ORAL | Status: DC
Start: 2021-08-15 — End: 2021-08-17
  Administered 2021-08-15: 1000 mL

## 2021-08-15 NOTE — Progress Notes (Addendum)
[facility] Progress Note Patient Name: Adriana Tucker DOB: July 02, 1981 MRN: [id number]   Date of Service  08/15/2021  HPI/Events of Note  Multiple issues: 1. Frequent watery stools, 2. Tube feeds coming from mouth, 3. Episodes of hypoglycemia today on Tube Feeds and D5 0.9 NaCl at 25 mL/hour, and 4. Increasing vasopressor requirements. Now on Norepinephrine IV infusion at 30 mcg/min and Vasopressin at 0.03 units/min.  eICU Interventions  Plan: Hold tube feeds. OGT to LIS. Increase D5 0.9 NaCl to 75 mL/hour. Monitor CVP now and Q 4 hours.      Intervention Category Major Interventions: Other:;Hypotension - evaluation and management  Lysle Dingwall 08/15/2021, 8:37 PM

## 2021-08-15 NOTE — Progress Notes (Signed)
NAME:  Adriana Tucker, MRN:  [id number], DOB:  11-05-1980, LOS: 3 ADMISSION DATE:  08/15/2021, CONSULTATION DATE:  11/17 REFERRING MD:  [place], EDP, CHIEF COMPLAINT:  AMS   History of Present Illness:  Adriana Tucker is a 40 y.o. female who has a PMH as outlined below including ESRD 2/2 lupus nephritis on HD TTS (last on Tues 11/15)   She resides in [place] and is in town visiting her sister.  She presented to [facility] ED 11/17 with AMS.  Family apparently had trouble waking her up that morning; therefore, EMS was called.  On EMS arrival, she was minimally responsive to noxious stimuli. Per mother, pt had been complaining of chills and always feeling "cold to the bone".  Also developed a cough the night prior to ED presentation.  Otherwise, no complaints and she was in her usual state of health.  She has had about roughly 50-60lbs weight loss in the past 2-3 months.  She has seen nephrology, rheumatology, and PCP back in [facility] without any explanation / diagnosis.   In ED, CT head and CTA head/neck negative for acute process (did show remote known bilateral cerebellar infarcts). CTA chest/abd/pelv neg for aneurysm or dissection, did show probable aspiration PNA and small volume ascites along with cholelithiasis. She was intubated for airway protection and she was found to be profoundly hypotensive; therefore, was started on NE, epi, vaso.  CVL and art line were placed by EDP. PCCM called for admission to ICU. Neuro consulted for assistance.  Pertinent  Medical History   Past Medical History:  Diagnosis Date   Brain aneurysm    ESRD (end stage renal disease) ([place])    due to lupus   Hypertension    Lupus (HCC)    Pancytopenia (HCC)    Pleuropericarditis    SAH (subarachnoid hemorrhage) ([place])    Significant Hospital Events: Including procedures, antibiotic start and stop dates in addition to other pertinent events   11/17 > admitted to ICU for AMS and shock; started on vasopressors and continued  on vent support 11/18 > CRRT started, bronchoscopy shows GNRs, echocardiogram shows possible valvular vegetation  Interim History / Subjective:  Overnight became bradycardic. Apparently sedation was weaned and then all pressors were off, but now back on two pressors and full sedation.  Stopped pulling fluid from CRRT.  Objective   Blood pressure (!) 120/52, pulse 93, temperature (!) 97.3 F (36.3 C), temperature source Axillary, resp. rate (!) 30, height 5\' 1"  (1.549 m), weight 61 kg, SpO2 100 %.    Vent Mode: PRVC FiO2 (%):  [50 %-60 %] 60 % Set Rate:  [26 bmp-30 bmp] 30 bmp Vt Set:  [380 mL] 380 mL PEEP:  [12 cmH20] 12 cmH20 Plateau Pressure:  [18 cmH20-32 cmH20] 31 cmH20   Intake/Output Summary (Last 24 hours) at 08/15/2021 0910 Last data filed at 08/15/2021 0700 Gross per 24 hour  Intake 2641.76 ml  Output 3811 ml  Net -1169.24 ml   Filed Weights   08/14/21 0319 08/15/21 0015 08/15/21 0107  Weight: 61.8 kg 61 kg 61 kg    Examination: General: acutely ill, not responsive  HENT: ETT to vent Lungs: diminished bilaterally, no wheezes or crackles Cardiovascular: tachycardic, regular, right sided tunneled HD line Abdomen: soft, nontender Neuro: deeply sedated, not responsive GU: Foley in place Skin: no rash   Resolved Hospital Problem list    Assessment & Plan:  Adriana Tucker is a 40 y.o. woman with history of SLE nephritis  and ESRD  Acute hypoxemic and hypercapnic respiratory failure in setting of ARDS 2/2  Pseudomonas PNA CT Chest with extensive bilateral consolidations consistent with aspiration PNA also noted on CXR this AM. Bedside US with dense consolidations in RLL. Patient requiring full vent support. ARDS physiology with P/F ratio 164 this morning.  - continue abx and sedation.  - increased RR to 30 and liberalized Vt to 8 cc/kg to help with hypercapnia. Goal pH>7.2    Septic shock 2/2 PSA PNA Patient continues to require vasopressor support in setting of  pneumonia.  - continue vanc and cefepime. Not tapering down abx yet due to no significant improve despite targeted therapy. Follow cultures to check  - Continue vasopressor support for goal MAP> 65 - Trend CBC and fever curve  Acute encephalopathy - not improving - reportedly following commands while off sedation on 11/17, but not tolerating time off sedation right now - still requires deep sedation with versed and fentanyl   ESRD on HD AGMA - likely 2/2 septic shock as above Has been on dialysis for 6 years due to lupus nephritis. Hypomagnesemia, hyperphosphatemia  - Nephrology consulted for dialysis - continue CRRT - Trend renal function  Anemia of chronic disease  Likely in due to chronic renal disease; Hb stable ~8 - Trend CBC - Transfuse for goal Hb >7  Hx of lupus  Adrenal insufficiency - hx of long term steroid use - Continue stress dose steroids   Hx of DM - currently on D10LR for hypoglycemia - will start tube feeds today  Abnormal Echocardiogram - concerning for endocarditis - blood cx negative - continue antibiotics and anti-fungal as ordered - will need TEE once more stable  Best Practice (right click and "Reselect all SmartList Selections" daily)   Diet/type: tubefeeds DVT prophylaxis: SCD GI prophylaxis: PPI Lines: Central line and Dialysis Catheter Foley:  Yes, and it is still needed Code Status:  full code Last date of multidisciplinary goals of care discussion [`11/19 mother updated over the phone, will update in person today. I am very concerned about her failure to improve over the last 48 hours despite maximum medical therapy. Will need to address code status]  Labs   CBC: Recent Labs  Lab 08/24/2021 0830 08/22/2021 0849 08/13/21 0404 08/13/21 0449 08/14/21 0323 08/14/21 0815 08/14/21 1633 08/15/21 0017 08/15/21 0256 08/15/21 0317  WBC 2.0*  --  1.4*  --  2.5*  --   --   --   --  0.8*  NEUTROABS 1.4*  --   --   --   --   --   --   --   --    --   HGB 7.7*   < > 8.7*   < > 7.9* 5.8* 7.5* 7.5* 7.8* 7.9*  HCT 25.3*   < > 26.9*   < > 24.2* 17.0* 22.0* 22.0* 23.0* 25.2*  MCV 92.7  --  87.1  --  88.6  --   --   --   --  90.3  PLT 50*  --  49*  --  35*  --   --   --   --  14*   < > = values in this interval not displayed.    Basic Metabolic Panel: Recent Labs  Lab 07/27/2021 2137 08/11/2021 2358 08/13/21 0404 08/13/21 0449 08/13/21 1909 08/14/21 0030 08/14/21 0323 08/14/21 0815 08/14/21 1600 08/14/21 1633 08/15/21 0017 08/15/21 0256 08/15/21 0317  NA 129*   < > 131*   < >  --    < >  131*   < > 134* 134* 135 135 136  K 4.6   < > 4.1   < >  --    < > 4.3   < > 4.4 4.0 4.3 4.2 4.3  CL 100  --  100  --   --   --  101  --  104  --   --   --  103  CO2 16*  --  16*  --   --   --  19*  --  20*  --   --   --  21*  GLUCOSE 158*  --  124*  --   --   --  146*  --  114*  --   --   --  73  BUN 41*  --  43*  --   --   --  24*  --  15  --   --   --  11  CREATININE 6.08*  --  6.27*  --   --   --  3.00*  --  1.72*  --   --   --  1.29*  CALCIUM 7.4*  --  7.8*  --   --   --  8.4*  --  9.1  --   --   --  10.0  MG  --   --  1.3*  --  2.8*  --  2.5*  --   --   --   --   --  2.4  PHOS  --   --  5.3*  --   --   --  4.4  --  3.4  --   --   --  3.3   < > = values in this interval not displayed.   GFR: Estimated Creatinine Clearance: 48.1 mL/min (A) (by C-G formula based on SCr of 1.29 mg/dL (H)). Recent Labs  Lab 08/04/2021 0830 08/05/2021 1030 07/27/2021 1100 08/13/21 0404 08/14/21 0323 08/15/21 0317  PROCALCITON  --  37.81  --   --   --   --   WBC 2.0*  --   --  1.4* 2.5* 0.8*  LATICACIDVEN 2.7*  --  2.2*  --   --   --     Liver Function Tests: Recent Labs  Lab 08/08/2021 0830 08/14/21 0323 08/14/21 1600 08/15/21 0317  AST 17  --   --   --   ALT 16  --   --   --   ALKPHOS 33*  --   --   --   BILITOT 1.1  --   --   --   PROT 4.0*  --   --   --   ALBUMIN 2.0* 1.7* 1.7* 1.7*   No results for input(s): LIPASE, AMYLASE in the last 168  hours. Recent Labs  Lab 08/03/2021 0830  AMMONIA 17    ABG    Component Value Date/Time   PHART 7.223 (L) 08/15/2021 0256   PCO2ART 52.5 (H) 08/15/2021 0256   PO2ART 66 (L) 08/15/2021 0256   HCO3 21.6 08/15/2021 0256   TCO2 23 08/15/2021 0256   ACIDBASEDEF 6.0 (H) 08/15/2021 0256   O2SAT 88.0 08/15/2021 0256     Coagulation Profile: No results for input(s): INR, PROTIME in the last 168 hours.  Cardiac Enzymes: No results for input(s): CKTOTAL, CKMB, CKMBINDEX, TROPONINI in the last 168 hours.  HbA1C: Hgb A1c MFr Bld  Date/Time Value Ref Range Status  12/30/2014 09:58 AM 6.2 (H) 4.8 - 5.6 %  Final    Comment:    (NOTE)         Pre-diabetes: 5.7 - 6.4         Diabetes: >6.4         Glycemic control for adults with diabetes: <7.0     CBG: Recent Labs  Lab 08/15/21 0330 08/15/21 0331 08/15/21 0354 08/15/21 0613 08/15/21 0718  GLUCAP 72 69* 119* 97 110*   Critical care time: 41 minutes    The patient is critically ill due to respiratory failure, renal failure.  Critical care was necessary to treat or prevent imminent or life-threatening deterioration.  Critical care was time spent personally by me on the following activities: development of treatment plan with patient and/or surrogate as well as nursing, discussions with consultants, evaluation of patient's response to treatment, examination of patient, obtaining history from patient or surrogate, ordering and performing treatments and interventions, ordering and review of laboratory studies, ordering and review of radiographic studies, pulse oximetry, re-evaluation of patient's condition and participation in multidisciplinary rounds.   Critical Care Time devoted to patient care services described in this note is 41 minutes. This time reflects time of care of this [place] . This critical care time does not reflect separately billable procedures or procedure time, teaching time or supervisory time of PA/NP/Med  student/Med Resident etc but could involve care discussion time.       Spero Geralds [facility] 08/15/2021 9:13 AM  Pager: see AMION  If no response to pager , please call critical care on call (see AMION) until 7pm After 7:00 pm call [place]

## 2021-08-15 NOTE — Progress Notes (Signed)
Discussed with Dr Shearon Stalls, patient continues to be quite sick, given that she had improved neurologically after initial presentation, I suspect hypotension/general medical condition as responsible for her MS changes. If she improves medically and there is still concern for her neurological status, could consider MRI brain. Neurology will be availabel as needed, please call with questions or concerns.   Roland Rack, MD [facility] [phone]  If 7pm- 7am, please page neurology on call as listed in [place].

## 2021-08-15 NOTE — Progress Notes (Signed)
[facility] Progress Note Patient Name: ALLIYA MARCON DOB: [birth year] MRN: [id number]   Date of Service  08/15/2021  HPI/Events of Note  Multiple issues: 1. WBC = 0.8, 2. Platelets = 14 and 3. ABG on 60%/PRVC 26/TV 380/P 12 = 7.22/52.5/66/21.6  eICU Interventions  Plan: Granex 300 mcg [place] now. Transfuse platelets for active bleeding or platelet count < 10. Increase PRVC rate to 30. Repeat ABG at 10 AM.   / Intervention Category Major Interventions: Respiratory failure - evaluation and management;Other:  Lysle Dingwall 08/15/2021, 4:59 AM

## 2021-08-15 NOTE — Progress Notes (Signed)
Date and time results received: 08/15/21 0412 (use smartphrase ".now" to insert current time)  Test: PLT Critical Value: 14  Name of Provider Notified: Elink provider  Orders Received? Or Actions Taken?:  Awaiting new orders

## 2021-08-15 NOTE — Progress Notes (Signed)
eLink Physician-Brief Progress Note Patient Name: BELISSA KOOY DOB: August 20, 1981 MRN: [id number]   Date of Service  08/15/2021  HPI/Events of Note  Patient overbreathing the ventilator set rate.   eICU Interventions  Plan: Increase ceiling on Fentanyl IV infusion to 300 mcg/hour. ABG STAT.     Intervention Category Major Interventions: Other:  Chana Lindstrom Cornelia Copa 08/15/2021, 1:51 AM

## 2021-08-15 NOTE — Progress Notes (Signed)
Date and time results received: 08/15/21 0412 (use smartphrase ".now" to insert current time)  Test: WBC Critical Value: 0.8  Name of Provider Notified: Elink provider  Orders Received? Or Actions Taken?:  Awaiting new orders

## 2021-08-15 NOTE — Progress Notes (Signed)
Progress Note  Patient Name: Adriana Tucker Date of Encounter: 08/15/2021  [facility] Cardiologist: New  Subjective   Intubated and sedated.  Inpatient Medications    Scheduled Meds:  chlorhexidine gluconate (MEDLINE KIT)  15 mL Mouth Rinse BID   Chlorhexidine Gluconate Cloth  6 each Topical Daily   darbepoetin (ARANESP) injection - DIALYSIS  40 mcg Subcutaneous Q Sat-HD   docusate  100 mg Per Tube BID   doxercalciferol  2 mcg Intravenous Q T,Th,Sat-1800   hydrocortisone sod succinate (SOLU-CORTEF) inj  100 mg Intravenous Q12H   mouth rinse  15 mL Mouth Rinse 10 times per day   pantoprazole (PROTONIX) IV  40 mg Intravenous Q12H   polyethylene glycol  17 g Per Tube Daily   sodium chloride flush  10-40 mL Intracatheter Q12H   Continuous Infusions:   prismasol BGK 4/2.5 200 mL/hr at 08/14/21 1835    prismasol BGK 4/2.5 400 mL/hr at 08/15/21 0539   sodium chloride Stopped (08/15/21 0215)   anidulafungin 100 mg (08/15/21 1010)   ceFEPime (MAXIPIME) IV 2 g (08/15/21 0936)   dextrose 5 % and 0.9% NaCl 25 mL/hr at 08/15/21 0900   doxycycline (VIBRAMYCIN) IV Stopped (08/15/21 0508)   feeding supplement (VITAL 1.5 CAL) 1,000 mL (08/15/21 0727)   fentaNYL infusion INTRAVENOUS     midazolam 4 mg/hr (08/15/21 0900)   norepinephrine (LEVOPHED) Adult infusion 16 mcg/min (08/15/21 0900)   norepinephrine Stopped (08/16/2021 1146)   prismasol BGK 4/2.5 1,750 mL/hr at 08/15/21 0537   vancomycin 750 mg (08/14/21 2311)   vasopressin 0.03 Units/min (08/15/21 0900)   PRN Meds: sodium chloride, alteplase, docusate, fentaNYL (SUBLIMAZE) injection, heparin, midazolam, midazolam, midazolam, norepinephrine, polyethylene glycol, polyvinyl alcohol, rocuronium, sodium chloride, sodium chloride flush   Vital Signs    Vitals:   08/15/21 0600 08/15/21 0700 08/15/21 0913 08/15/21 0914  BP:   (!) 122/55   Pulse:   86   Resp: (!) 30 (!) 30 (!) 30   Temp: (!) 97.3 F (36.3 C)     TempSrc:  Axillary     SpO2:   98% 100%  Weight:      Height:        Intake/Output Summary (Last 24 hours) at 08/15/2021 1058 Last data filed at 08/15/2021 0900 Gross per 24 hour  Intake 2809.01 ml  Output 3971 ml  Net -1161.99 ml   Last 3 Weights 08/15/2021 08/15/2021 08/14/2021  Weight (lbs) 134 lb 7.7 oz 134 lb 7.7 oz 136 lb 3.9 oz  Weight (kg) 61 kg 61 kg 61.8 kg      Telemetry    Sinus - Personally Reviewed  Physical Exam   GEN: Intubated and sedated Neck: No JVD Cardiac: RRR Respiratory: CTA anteriorly GI: Soft, ND MS: Trace edema Neuro:  Intubated and sedated; cannot assess Psych: Intubated and sedated; cannot assess  Labs    High Sensitivity Troponin:   Recent Labs  Lab 08/04/2021 0830 08/13/2021 1030  TROPONINIHS 49* 129*     Chemistry Recent Labs  Lab 08/16/2021 0830 08/03/2021 0849 08/13/21 1909 08/14/21 0030 08/14/21 0323 08/14/21 0815 08/14/21 1600 08/14/21 1633 08/15/21 0256 08/15/21 0317 08/15/21 0921  NA 136   < >  --    < > 131*   < > 134*   < > 135 136 136  K 2.9*   < >  --    < > 4.3   < > 4.4   < > 4.2 4.3 3.9  CL 102   < >  --   --  101  --  104  --   --  103  --   CO2 23   < >  --   --  19*  --  20*  --   --  21*  --   GLUCOSE 86   < >  --   --  146*  --  114*  --   --  73  --   BUN 37*   < >  --   --  24*  --  15  --   --  11  --   CREATININE 6.37*   < >  --   --  3.00*  --  1.72*  --   --  1.29*  --   CALCIUM 7.4*   < >  --   --  8.4*  --  9.1  --   --  10.0  --   MG  --    < > 2.8*  --  2.5*  --   --   --   --  2.4  --   PROT 4.0*  --   --   --   --   --   --   --   --   --   --   ALBUMIN 2.0*  --   --   --  1.7*  --  1.7*  --   --  1.7*  --   AST 17  --   --   --   --   --   --   --   --   --   --   ALT 16  --   --   --   --   --   --   --   --   --   --   ALKPHOS 33*  --   --   --   --   --   --   --   --   --   --   BILITOT 1.1  --   --   --   --   --   --   --   --   --   --   GFRNONAA 8*   < >  --   --  19*  --  38*  --   --  53*  --    ANIONGAP 11   < >  --   --  11  --  10  --   --  12  --    < > = values in this interval not displayed.    Lipids  Recent Labs  Lab 08/14/21 0323  TRIG 344*    Hematology Recent Labs  Lab 08/13/21 0404 08/13/21 0449 08/14/21 0323 08/14/21 0815 08/15/21 0256 08/15/21 0317 08/15/21 0921  WBC 1.4*  --  2.5*  --   --  0.8*  --   RBC 3.09*  --  2.73*  --   --  2.79*  --   HGB 8.7*   < > 7.9*   < > 7.8* 7.9* 7.5*  HCT 26.9*   < > 24.2*   < > 23.0* 25.2* 22.0*  MCV 87.1  --  88.6  --   --  90.3  --   MCH 28.2  --  28.9  --   --  28.3  --   MCHC 32.3  --  32.6  --   --  31.3  --   RDW 15.8*  --  16.2*  --   --  16.5*  --   PLT 49*  --  35*  --   --  14*  --    < > = values in this interval not displayed.   Thyroid  Recent Labs  Lab 08/08/2021 1030  TSH 2.400    Radiology    DG CHEST PORT 1 VIEW  Result Date: 08/14/2021 CLINICAL DATA:  Evaluate bibasilar opacities. EXAM: PORTABLE CHEST 1 VIEW COMPARISON:  August 13, 2021 FINDINGS: The ETT is in good position. The NG tube terminates below today's film. The right central line is stable. Bibasilar infiltrates remain, not significantly changed in the interval. No pneumothorax. No other changes. IMPRESSION: Stable bibasilar infiltrates.  Support apparatus as above. Electronically Signed   By: Dorise Bullion III M.D.   On: 08/14/2021 19:03   ECHOCARDIOGRAM COMPLETE  Result Date: 08/13/2021    ECHOCARDIOGRAM REPORT   Patient Name:   Adriana Tucker Date of Exam: 08/13/2021 Medical Rec #:  [id number]        Height:       61.0 in Accession #:    [id number]       Weight:       136.7 lb Date of Birth:  Feb 16, 1981       BSA:          1.607 m Patient Age:    40 years         BP:           100/65 mmHg Patient Gender: F                HR:           105 bpm. Exam Location:  Inpatient Procedure: 2D Echo, 3D Echo, Cardiac Doppler and Color Doppler Indications:    I50.40* Unspecified combined systolic (congestive) and diastolic                  (congestive) heart failure  History:        Patient has no prior history of Echocardiogram examinations.                 Stroke; Risk Factors:Hypertension, Diabetes and Dyslipidemia.                 ESRD. Dialysis. Shock. Lupus.  Sonographer:    Roseanna Rainbow RDCS Referring Phys: [id number]  1. Severe thickening and calcification of the mitral valve suggests chronic calcification. However, there are mobile densities on the mitral and aortic valves, raising concern for endocardits. Recommed cardiology consultation and TEE to better evaluate.  2. Left ventricular ejection fraction, by estimation, is 60 to 65%. The left ventricle has normal function. The left ventricle has no regional wall motion abnormalities. There is mild left ventricular hypertrophy of the septal segment. Left ventricular diastolic parameters are consistent with Grade II diastolic dysfunction (pseudonormalization). Elevated left atrial pressure.  3. Right ventricular systolic function is normal. The right ventricular size is normal. There is normal pulmonary artery systolic pressure.  4. Severe thickening of the mitral valve leaflets. There is a mobile density on the atrial side of the posterior annulus. Findings are concerning for endocarditis. The mitral valve is abnormal. Mild mitral valve regurgitation. Moderate to severe mitral stenosis.  5. Tricuspid valve regurgitation is severe.  6. Mobile densities noted on the LVOT and aortic sides of the aortic valve. Concerning for endocarditis. The aortic valve is abnormal. Aortic valve regurgitation is mild. Mild aortic valve  stenosis. Aortic valve mean gradient measures 11.0 mmHg. Aortic valve Vmax measures 2.23 m/s.  7. The inferior vena cava is normal in size with greater than 50% respiratory variability, suggesting right atrial pressure of 3 mmHg. FINDINGS  Left Ventricle: Left ventricular ejection fraction, by estimation, is 60 to 65%. The left ventricle has normal function.  The left ventricle has no regional wall motion abnormalities. The left ventricular internal cavity size was normal in size. There is  mild left ventricular hypertrophy of the septal segment. Left ventricular diastolic parameters are consistent with Grade II diastolic dysfunction (pseudonormalization). Elevated left atrial pressure. Right Ventricle: The right ventricular size is normal. No increase in right ventricular wall thickness. Right ventricular systolic function is normal. There is normal pulmonary artery systolic pressure. The tricuspid regurgitant velocity is 2.06 m/s, and  with an assumed right atrial pressure of 3 mmHg, the estimated right ventricular systolic pressure is 91.6 mmHg. Left Atrium: Left atrial size was normal in size. Right Atrium: Right atrial size was normal in size. Pericardium: There is no evidence of pericardial effusion. Mitral Valve: Severe thickening of the mitral valve leaflets. There is a mobile density on the atrial side of the posterior annulus. Findings are concerning for endocarditis. The mitral valve is abnormal. There is severe thickening of the mitral valve leaflet(s). Mild mitral valve regurgitation. Moderate to severe mitral valve stenosis. MV peak gradient, 18.3 mmHg. The mean mitral valve gradient is 7.0 mmHg with average heart rate of 105 bpm. Tricuspid Valve: The tricuspid valve is normal in structure. Tricuspid valve regurgitation is severe. No evidence of tricuspid stenosis. Aortic Valve: Mobile densities noted on the LVOT and aortic sides of the aortic valve. Concerning for endocarditis. The aortic valve is abnormal. Aortic valve regurgitation is mild. Mild aortic stenosis is present. Aortic valve mean gradient measures 11.0 mmHg. Aortic valve peak gradient measures 20.0 mmHg. Pulmonic Valve: The pulmonic valve was normal in structure. Pulmonic valve regurgitation is not visualized. No evidence of pulmonic stenosis. Aorta: The aortic root is normal in size and  structure. Venous: IVC assessment for right atrial pressure unable to be performed due to mechanical ventilation. The inferior vena cava is normal in size with greater than 50% respiratory variability, suggesting right atrial pressure of 3 mmHg. IAS/Shunts: No atrial level shunt detected by color flow Doppler. Additional Comments: A device lead is visualized.  LEFT VENTRICLE PLAX 2D LVIDd:         3.20 cm     Diastology LVIDs:         2.30 cm     LV e' medial:    7.07 cm/s LV PW:         1.00 cm     LV E/e' medial:  23.5 LV IVS:        1.30 cm     LV e' lateral:   5.66 cm/s                            LV E/e' lateral: 29.3  LV Volumes (MOD) LV vol d, MOD A2C: 58.6 ml LV vol d, MOD A4C: 48.7 ml LV vol s, MOD A2C: 26.6 ml LV vol s, MOD A4C: 22.2 ml LV SV MOD A2C:     32.0 ml LV SV MOD A4C:     48.7 ml LV SV MOD BP:      29.8 ml RIGHT VENTRICLE  IVC RV S prime:     9.00 cm/s  IVC diam: 1.75 cm TAPSE (M-mode): 1.3 cm LEFT ATRIUM             Index        RIGHT ATRIUM          Index LA diam:        3.60 cm 2.24 cm/m   RA Area:     8.44 cm LA Vol (A2C):   32.3 ml 20.10 ml/m  RA Volume:   14.50 ml 9.03 ml/m LA Vol (A4C):   29.4 ml 18.30 ml/m LA Biplane Vol: 34.1 ml 21.22 ml/m  AORTIC VALVE AV Vmax:           223.33 cm/s AV Vmean:          154.667 cm/s AV VTI:            0.313 m AV Peak Grad:      20.0 mmHg AV Mean Grad:      11.0 mmHg LVOT Vmax:         202.00 cm/s LVOT Vmean:        138.000 cm/s LVOT VTI:          0.266 m LVOT/AV VTI ratio: 0.85  AORTA Ao Root diam: 2.60 cm Ao Asc diam:  2.60 cm MITRAL VALVE                  TRICUSPID VALVE MV Area (PHT): 3.85 cm       TR Peak grad:   17.0 mmHg MV Peak grad:  18.3 mmHg      TR Vmax:        206.00 cm/s MV Mean grad:  7.0 mmHg MV Vmax:       2.14 m/s       SHUNTS MV Vmean:      121.0 cm/s     Systemic VTI: 0.27 m MV Decel Time: 197 msec MR Peak grad:    45.2 mmHg MR Mean grad:    22.0 mmHg MR Vmax:         336.00 cm/s MR Vmean:        214.0 cm/s MR PISA:          1.57 cm MR PISA Eff ROA: 19 mm MR PISA Radius:  0.50 cm MV E velocity: 166.00 cm/s MV A velocity: 154.00 cm/s MV E/A ratio:  1.08 Skeet Latch MD Electronically signed by Skeet Latch MD Signature Date/Time: 08/13/2021/6:05:09 PM    Final      Patient Profile     40 year old female with past medical history of end-stage renal disease dialysis dependent (history of lupus nephritis), subarachnoid hemorrhage due to rupture of cerebral aneurysm status postrepair with residual weakness, pancytopenia, hypertension admitted with encephalopathy, pneumonia and septic shock for evaluation of abnormal echocardiogram.  Patient was admitted with cough followed by altered mental status, septic shock and pancytopenia.  Also with history of previous subarachnoid hemorrhage with quadriplegia.  Echocardiogram showed normal LV function, mild left ventricular hypertrophy, grade 2 diastolic dysfunction, severely thickened mitral valve with moderate to severe mitral stenosis, mild mitral regurgitation, severe tricuspid regurgitation, mild aortic stenosis/mild aortic insufficiency and question mobile densities on the aortic and mitral valve concerning for endocarditis.  Assessment & Plan    1 abnormal echocardiogram-patient found to have mitral stenosis/mitral regurgitation and aortic stenosis/aortic insufficiency on echocardiogram.  There is a question of vegetations.  I have personally reviewed this echocardiogram.  Ultimately she will need a transesophageal  echocardiogram but given multiple ongoing issues including septic shock and pancytopenia we will defer this for now.  Would continue antibiotics.  She clearly would not be a surgical candidate at present if valve replacement were ever required.   2 VDRF/aspiration pneumonia/septic shock-continue antibiotics.  She remains on pressors today.   3 end-stage renal disease-dialysis dependent.  Nephrology following.  For questions or updates, please contact [place] Please consult [email or web address] for contact info under        Signed, Kirk Ruths, MD  08/15/2021, 10:58 AM

## 2021-08-15 NOTE — Progress Notes (Addendum)
[facility] Progress Note  Subjective: remains on 2 pressors, I/O - 1.1 L yesterday, wt's stable. Had difficult night resp wise, FiO2 up to 1.00 today.   Vitals:   08/15/21 0500 08/15/21 0508 08/15/21 0530 08/15/21 0536  BP:      Pulse:  93    Resp: (!) 26 (!) 27 (!) 30 (!) 30  Temp:  (!) 97.2 F (36.2 C)    TempSrc:  Axillary    SpO2:  100%    Weight:      Height:        Exam: General: intubated and sedated chronically ill appearing Head: NCAT +sclera icterus  Neck: Supple.  Lungs: on vent, bilat rhonchi ant, no wheezing Heart: RRR. No murmur, rubs or gallops.  Abdomen: soft, nontender, +BS, +ascites Lower extremities: 1+ edema LE's/ UE's Neuro: unresponsive, sedated  Dialysis Access: [facility] RIJ    Home meds - wellbutrin er, decadron, diflucan, neurontin, percocet prn, lipitor, clonidine 0.4 tid, lasix 80 qd, glipizide, hydralazine 100 tid, toprol xl 100 bid, prns    Summary: hx of SLE, brain aneurysm repair/ CVA's w/ L sided weakness, uses WC, DM2, HTN, ESRD on HD. Here w/ wt loss 50 lbs over 4 mos, difficulty swallowing. No HD issues. Frequent UTI's, home self-cath once daily. New sx's were hacking cough. Found unresponsive and 911 was called.     OP HD: [facility] TTS (transient, usual HD is in [place])   3.5h  300/500  55kg  2/2 bath  R IJ TDC Heparin 3000  - mircera 30 q2, not yet given  - hect 2 ug tiw   Assessment/ Plan: Septic shock /Pseudomonas PNA / aspiration PNA- getting IV abx x 3 and IV antifungals. Remains on pressors x 2 Acute hypoxic resp failure / ARDS/ asp PNA - per CCM, on vent ESRD - on HD TTS, transient from [place]. CRRT started here 11/18. Remain sin shock on pressors, cont CRRT.      Volume - +sig LE edema, 5-6kg up. Not sure we can safely UF w/ her hemodynamic instability. Keep even for now.  Anemia CKD - Hb 7- 9, started darbe 40 sq weekly, 1st dose 11/20. Low iron but cannot get IV Fe d/t very high ferritin > 7500.  MBD ckd - cont vdra IV w/  HD, resume binders if /when eating H/o SLE H/o adrenal insufficiency - getting IV steroids here DM2 - per CCM Abnormal echo - possible vegetation, will need TEE when more stable     Rob Amal Saiki 08/15/2021, 5:47 AM   Recent Labs  Lab 08/14/21 1600 08/14/21 1633 08/15/21 0256 08/15/21 0317  K 4.4   < > 4.2 4.3  BUN 15  --   --  11  CREATININE 1.72*  --   --  1.29*  CALCIUM 9.1  --   --  10.0  PHOS 3.4  --   --  3.3  HGB  --    < > 7.8* 7.9*   < > = values in this interval not displayed.    Inpatient medications:  chlorhexidine gluconate (MEDLINE KIT)  15 mL Mouth Rinse BID   Chlorhexidine Gluconate Cloth  6 each Topical Daily   darbepoetin (ARANESP) injection - DIALYSIS  40 mcg Subcutaneous Q Sat-HD   docusate  100 mg Per Tube BID   doxercalciferol  2 mcg Intravenous Q T,Th,Sat-1800   hydrocortisone sod succinate (SOLU-CORTEF) inj  100 mg Intravenous Q12H   mouth rinse  15 mL Mouth Rinse  10 times per day   pantoprazole (PROTONIX) IV  40 mg Intravenous Q12H   polyethylene glycol  17 g Per Tube Daily   sodium chloride flush  10-40 mL Intracatheter Q12H   Tbo-filgastrim (GRANIX) SQ  300 mcg Subcutaneous Once     prismasol BGK 4/2.5 200 mL/hr at 08/14/21 1835    prismasol BGK 4/2.5 400 mL/hr at 08/15/21 0539   sodium chloride Stopped (08/15/21 0215)   anidulafungin 100 mg (08/14/21 1014)   ceFEPime (MAXIPIME) IV 2 g (08/14/21 2203)   dextrose 5 % and 0.9% NaCl 25 mL/hr at 08/15/21 0500   doxycycline (VIBRAMYCIN) IV 125 mL/hr at 08/15/21 0500   feeding supplement (VITAL 1.5 CAL) 55 mL/hr at 08/15/21 0046   fentaNYL infusion INTRAVENOUS 250 mcg/hr (08/15/21 0500)   midazolam 4 mg/hr (08/15/21 0500)   norepinephrine (LEVOPHED) Adult infusion 15 mcg/min (08/15/21 0500)   norepinephrine Stopped (08/18/2021 1146)   prismasol BGK 4/2.5 1,750 mL/hr at 08/15/21 0537   vancomycin 750 mg (08/14/21 2311)   vasopressin 0.03 Units/min (08/15/21 0500)   sodium chloride, alteplase,  docusate, fentaNYL (SUBLIMAZE) injection, heparin, midazolam, midazolam, midazolam, norepinephrine, polyethylene glycol, polyvinyl alcohol, rocuronium, sodium chloride, sodium chloride flush

## 2021-08-16 ENCOUNTER — Inpatient Hospital Stay (HOSPITAL_COMMUNITY): Payer: Medicare Other

## 2021-08-16 DIAGNOSIS — G9340 Encephalopathy, unspecified: Secondary | ICD-10-CM | POA: Diagnosis not present

## 2021-08-16 LAB — POCT I-STAT 7, (LYTES, BLD GAS, ICA,H+H)
Acid-base deficit: 18 mmol/L — ABNORMAL HIGH (ref 0.0–2.0)
Acid-base deficit: 4 mmol/L — ABNORMAL HIGH (ref 0.0–2.0)
Acid-base deficit: 8 mmol/L — ABNORMAL HIGH (ref 0.0–2.0)
Bicarbonate: 11.7 mmol/L — ABNORMAL LOW (ref 20.0–28.0)
Bicarbonate: 19.8 mmol/L — ABNORMAL LOW (ref 20.0–28.0)
Bicarbonate: 22.5 mmol/L (ref 20.0–28.0)
Calcium, Ion: 1.49 mmol/L — ABNORMAL HIGH (ref 1.15–1.40)
Calcium, Ion: 1.54 mmol/L (ref 1.15–1.40)
Calcium, Ion: 1.58 mmol/L (ref 1.15–1.40)
HCT: 19 % — ABNORMAL LOW (ref 36.0–46.0)
HCT: 21 % — ABNORMAL LOW (ref 36.0–46.0)
HCT: 23 % — ABNORMAL LOW (ref 36.0–46.0)
Hemoglobin: 6.5 g/dL — CL (ref 12.0–15.0)
Hemoglobin: 7.1 g/dL — ABNORMAL LOW (ref 12.0–15.0)
Hemoglobin: 7.8 g/dL — ABNORMAL LOW (ref 12.0–15.0)
O2 Saturation: 92 %
O2 Saturation: 94 %
O2 Saturation: 99 %
Patient temperature: 96.9
Patient temperature: 97.2
Patient temperature: 97.9
Potassium: 3.7 mmol/L (ref 3.5–5.1)
Potassium: 4.1 mmol/L (ref 3.5–5.1)
Potassium: 4.8 mmol/L (ref 3.5–5.1)
Sodium: 136 mmol/L (ref 135–145)
Sodium: 138 mmol/L (ref 135–145)
Sodium: 138 mmol/L (ref 135–145)
TCO2: 13 mmol/L — ABNORMAL LOW (ref 22–32)
TCO2: 21 mmol/L — ABNORMAL LOW (ref 22–32)
TCO2: 24 mmol/L (ref 22–32)
pCO2 arterial: 45.4 mmHg (ref 32.0–48.0)
pCO2 arterial: 47.4 mmHg (ref 32.0–48.0)
pCO2 arterial: 50.8 mmHg — ABNORMAL HIGH (ref 32.0–48.0)
pH, Arterial: 7.015 — CL (ref 7.350–7.450)
pH, Arterial: 7.197 — CL (ref 7.350–7.450)
pH, Arterial: 7.279 — ABNORMAL LOW (ref 7.350–7.450)
pO2, Arterial: 158 mmHg — ABNORMAL HIGH (ref 83.0–108.0)
pO2, Arterial: 77 mmHg — ABNORMAL LOW (ref 83.0–108.0)
pO2, Arterial: 98 mmHg (ref 83.0–108.0)

## 2021-08-16 LAB — CBC WITH DIFFERENTIAL/PLATELET
Abs Immature Granulocytes: 0 10*3/uL (ref 0.00–0.07)
Abs Immature Granulocytes: 0.02 10*3/uL (ref 0.00–0.07)
Basophils Absolute: 0 10*3/uL (ref 0.0–0.1)
Basophils Absolute: 0 10*3/uL (ref 0.0–0.1)
Basophils Relative: 0 %
Basophils Relative: 1 %
Eosinophils Absolute: 0 10*3/uL (ref 0.0–0.5)
Eosinophils Absolute: 0 10*3/uL (ref 0.0–0.5)
Eosinophils Relative: 0 %
Eosinophils Relative: 0 %
HCT: 23.9 % — ABNORMAL LOW (ref 36.0–46.0)
HCT: 22.9 % — ABNORMAL LOW (ref 36.0–46.0)
Hemoglobin: 7.3 g/dL — ABNORMAL LOW (ref 12.0–15.0)
Hemoglobin: 7 g/dL — ABNORMAL LOW (ref 12.0–15.0)
Immature Granulocytes: 0 %
Immature Granulocytes: 1 %
Lymphocytes Relative: 8 %
Lymphocytes Relative: 3 %
Lymphs Abs: 0.2 10*3/uL — ABNORMAL LOW (ref 0.7–4.0)
Lymphs Abs: 0.1 10*3/uL — ABNORMAL LOW (ref 0.7–4.0)
MCH: 27.8 pg (ref 26.0–34.0)
MCH: 28.6 pg (ref 26.0–34.0)
MCHC: 30.5 g/dL (ref 30.0–36.0)
MCHC: 30.6 g/dL (ref 30.0–36.0)
MCV: 90.9 fL (ref 80.0–100.0)
MCV: 93.5 fL (ref 80.0–100.0)
Monocytes Absolute: 0 10*3/uL — ABNORMAL LOW (ref 0.1–1.0)
Monocytes Absolute: 0.1 10*3/uL (ref 0.1–1.0)
Monocytes Relative: 2 %
Monocytes Relative: 3 %
Neutro Abs: 2 10*3/uL (ref 1.7–7.7)
Neutro Abs: 3.4 10*3/uL (ref 1.7–7.7)
Neutrophils Relative %: 90 %
Neutrophils Relative %: 92 %
Platelets: <5 10*3/uL — CL (ref 150–400)
Platelets: 6 10*3/uL — CL (ref 150–400)
RBC: 2.63 MIL/uL — ABNORMAL LOW (ref 3.87–5.11)
RBC: 2.45 MIL/uL — ABNORMAL LOW (ref 3.87–5.11)
RDW: 16.9 % — ABNORMAL HIGH (ref 11.5–15.5)
RDW: 17.2 % — ABNORMAL HIGH (ref 11.5–15.5)
WBC: 2.2 10*3/uL — ABNORMAL LOW (ref 4.0–10.5)
WBC: 3.6 10*3/uL — ABNORMAL LOW (ref 4.0–10.5)
nRBC: 1.8 % — ABNORMAL HIGH (ref 0.0–0.2)
nRBC: 2 /100 WBC — ABNORMAL HIGH
nRBC: 2.2 % — ABNORMAL HIGH (ref 0.0–0.2)

## 2021-08-16 LAB — RENAL FUNCTION PANEL
Albumin: <1.5 g/dL — ABNORMAL LOW (ref 3.5–5.0)
Albumin: 1.5 g/dL — ABNORMAL LOW (ref 3.5–5.0)
Anion gap: 9 (ref 5–15)
Anion gap: 20 — ABNORMAL HIGH (ref 5–15)
BUN: 8 mg/dL (ref 6–20)
BUN: 10 mg/dL (ref 6–20)
CO2: 21 mmol/L — ABNORMAL LOW (ref 22–32)
CO2: 12 mmol/L — ABNORMAL LOW (ref 22–32)
Calcium: 10.1 mg/dL (ref 8.9–10.3)
Calcium: 11.5 mg/dL — ABNORMAL HIGH (ref 8.9–10.3)
Chloride: 107 mmol/L (ref 98–111)
Chloride: 106 mmol/L (ref 98–111)
Creatinine, Ser: 0.89 mg/dL (ref 0.44–1.00)
Creatinine, Ser: 0.95 mg/dL (ref 0.44–1.00)
GFR, Estimated: >60 mL/min (ref >60)
GFR, Estimated: >60 mL/min (ref >60)
Glucose, Bld: 150 mg/dL — ABNORMAL HIGH (ref 70–99)
Glucose, Bld: 178 mg/dL — ABNORMAL HIGH (ref 70–99)
Phosphorus: 2.8 mg/dL (ref 2.5–4.6)
Phosphorus: 5.2 mg/dL — ABNORMAL HIGH (ref 2.5–4.6)
Potassium: 3.8 mmol/L (ref 3.5–5.1)
Potassium: 4.9 mmol/L (ref 3.5–5.1)
Sodium: 137 mmol/L (ref 135–145)
Sodium: 138 mmol/L (ref 135–145)

## 2021-08-16 LAB — DIC (DISSEMINATED INTRAVASCULAR COAGULATION)PANEL
D-Dimer, Quant: 7.18 ug/mL-FEU — ABNORMAL HIGH (ref 0.00–0.50)
Fibrinogen: 508 mg/dL — ABNORMAL HIGH (ref 210–475)
INR: 1.7 — ABNORMAL HIGH (ref 0.8–1.2)
Platelets: <5 10*3/uL — CL (ref 150–400)
Prothrombin Time: 19.7 seconds — ABNORMAL HIGH (ref 11.4–15.2)
Smear Review: NONE SEEN
aPTT: 42 seconds — ABNORMAL HIGH (ref 24–36)

## 2021-08-16 LAB — MAGNESIUM: Magnesium: 2.4 mg/dL (ref 1.7–2.4)

## 2021-08-16 LAB — PATHOLOGIST SMEAR REVIEW

## 2021-08-16 LAB — GLUCOSE, CAPILLARY
Glucose-Capillary: 131 mg/dL — ABNORMAL HIGH (ref 70–99)
Glucose-Capillary: 134 mg/dL — ABNORMAL HIGH (ref 70–99)
Glucose-Capillary: 134 mg/dL — ABNORMAL HIGH (ref 70–99)
Glucose-Capillary: 153 mg/dL — ABNORMAL HIGH (ref 70–99)
Glucose-Capillary: 73 mg/dL (ref 70–99)
Glucose-Capillary: 80 mg/dL (ref 70–99)

## 2021-08-16 LAB — ABO/RH: ABO/RH(D): O POS

## 2021-08-16 LAB — APTT: aPTT: 37 seconds — ABNORMAL HIGH (ref 24–36)

## 2021-08-16 MED ORDER — SODIUM BICARBONATE 8.4 % IV SOLN
INTRAVENOUS | Status: AC
  Administered 2021-08-16: 100 meq via INTRAVENOUS
  Filled 2021-08-16: qty 100

## 2021-08-16 MED ORDER — EPINEPHRINE HCL 5 MG/250ML IV SOLN IN NS: 0.5000 ug/min | INTRAVENOUS | Status: DC

## 2021-08-16 MED ORDER — AMIODARONE HCL IN DEXTROSE 360-4.14 MG/200ML-% IV SOLN
30.0000 mg/h | INTRAVENOUS | Status: DC
  Filled 2021-08-16: qty 200

## 2021-08-16 MED ORDER — AMIODARONE HCL IN DEXTROSE 360-4.14 MG/200ML-% IV SOLN: 30.0000 mg/h | INTRAVENOUS | Status: DC

## 2021-08-16 MED ORDER — POTASSIUM CHLORIDE 10 MEQ/50ML IV SOLN
10.0000 meq | INTRAVENOUS | Status: AC
  Administered 2021-08-16 (×4): 10 meq via INTRAVENOUS
  Filled 2021-08-16 (×4): qty 50

## 2021-08-16 MED ORDER — AMIODARONE LOAD VIA INFUSION: 150.0000 mg | Freq: Once | INTRAVENOUS | Status: DC

## 2021-08-16 MED ORDER — AMIODARONE IV BOLUS ONLY 150 MG/100ML
150.0000 mg | Freq: Once | INTRAVENOUS | Status: AC
  Administered 2021-08-16: 150 mg via INTRAVENOUS
  Filled 2021-08-16: qty 100

## 2021-08-16 MED ORDER — DARBEPOETIN ALFA 200 MCG/0.4ML IJ SOSY: 200.0000 ug | PREFILLED_SYRINGE | INTRAMUSCULAR | Status: DC

## 2021-08-16 MED ORDER — AMIODARONE IV BOLUS ONLY 150 MG/100ML
150.0000 mg | Freq: Once | INTRAVENOUS | Status: AC
  Administered 2021-08-16: 150 mg via INTRAVENOUS

## 2021-08-16 MED ORDER — AMIODARONE HCL IN DEXTROSE 360-4.14 MG/200ML-% IV SOLN: 60.0000 mg/h | INTRAVENOUS | Status: DC

## 2021-08-16 MED ORDER — EPINEPHRINE HCL 5 MG/250ML IV SOLN IN NS
INTRAVENOUS | Status: AC
  Filled 2021-08-16: qty 250

## 2021-08-16 MED ORDER — SODIUM BICARBONATE 8.4 % IV SOLN: 100.0000 meq | Freq: Once | INTRAVENOUS | Status: AC

## 2021-08-16 MED ORDER — SODIUM BICARBONATE 8.4 % IV SOLN
INTRAVENOUS | Status: DC
  Filled 2021-08-16 (×2): qty 1000

## 2021-08-16 MED ORDER — AMIODARONE HCL IN DEXTROSE 360-4.14 MG/200ML-% IV SOLN
60.0000 mg/h | INTRAVENOUS | Status: AC
  Administered 2021-08-16 (×2): 60 mg/h via INTRAVENOUS
  Filled 2021-08-16: qty 200

## 2021-08-16 MED ORDER — AMIODARONE HCL IN DEXTROSE 360-4.14 MG/200ML-% IV SOLN
INTRAVENOUS | Status: AC
  Administered 2021-08-16: 60 mg/h via INTRAVENOUS
  Filled 2021-08-16: qty 200

## 2021-08-16 MED ORDER — SODIUM CHLORIDE 0.9% IV SOLUTION: Freq: Once | INTRAVENOUS | Status: AC

## 2021-08-16 NOTE — Progress Notes (Signed)
[place] Progress Note Patient Name: Adriana Tucker DOB: [birth year] MRN: [id number]   Date of Service  08/16/2021  HPI/Events of Note  ABG on 100%/PRVC 30/TV 380/P 12 = 7.27/47/158/22.5.  eICU Interventions  Continue present management.     Intervention Category Major Interventions: Respiratory failure - evaluation and management  Afifa Truax Eugene 08/16/2021, 3:14 AM

## 2021-08-16 NOTE — Progress Notes (Signed)
Pharmacy Antibiotic Note  Adriana Tucker is a 40 y.o. female admitted on 08/11/2021 with sepsis after being found unresponsive.  Pharmacy has been consulted for vancomycin, cefepime, Eraxis, and doxycycline dosing. ESRD-HD, dry wt may be ~ 52kg. Put on CRRT 11/18 ~1500 and broadened coverage for atypicals and fungal infections since pt was on fluconazole prior to admission and takes cellcept and steroids for lupus.MRSA negative and vancomycin discontinued. Pseudomonas positive on respiratory cultures. Consider narrowing to cefepime tomorrow.   Plan: Continue cefepime to 2 g IV BID with CRRT Continue doxycycline 100 mg IV BID Continue Eraxis 200 mg IV load, then 100 mg IV q24h  Height: 5\' 1"  (154.9 cm) Weight: 60.8 kg (134 lb 0.6 oz) IBW/kg (Calculated) : 47.8  Temp (24hrs), Avg:97.9 F (36.6 C), Min:96.9 F (36.1 C), Max:100 F (37.8 C)  Recent Labs  Lab 07/31/2021 0830 08/05/2021 0849 08/02/2021 1100 08/01/2021 2137 08/13/21 0404 08/14/21 0323 08/14/21 1600 08/15/21 0317 08/15/21 1600 08/16/21 0404 08/16/21 1347  WBC 2.0*  --   --   --  1.4* 2.5*  --  0.8*  --  2.2* 3.6*  CREATININE 6.37*   < >  --    < > 6.27* 3.00* 1.72* 1.29* 1.05* 0.89  --   LATICACIDVEN 2.7*  --  2.2*  --   --   --   --   --   --   --   --    < > = values in this interval not displayed.     Estimated Creatinine Clearance: 69.6 mL/min (by C-G formula based on SCr of 0.89 mg/dL).    No Known Allergies  Antimicrobials this admission: Vancomycin 11/18 >> 11/21 Cefepime 11/18 >>  Doxycycline 11/18 >>  Eraxis 11/18 >>   Dose adjustments this admission: CRRT dose adjustments.  Microbiology results: 11/17 BCx: NGTD 11/17 MRSA PCR: not detected  11/18 RSV: negative  11/18 Bronchial lavage cxs: pseudomonas + sensitivities   Thank you for allowing pharmacy to be a part of this patient's care.  Varney Daily, PharmD PGY1 Pharmacy Resident  Please check AMION for all [facility] pharmacy phone numbers After  10:00 PM call main pharmacy [phone]

## 2021-08-16 NOTE — Progress Notes (Signed)
PIV consult: Arrived to room oncoming RN reports no current need for PIV. He will enter new consult if needed.

## 2021-08-16 NOTE — Progress Notes (Signed)
[facility] Progress Note  Subjective: remains on 2 pressors, I/O - only 200 mL yesterday -  FiO2 still at 100 -  platelets discovered to be less than 5 this AM-  mother is at bedside   Vitals:   08/16/21 0600 08/16/21 0615 08/16/21 0630 08/16/21 0700  BP:      Pulse:      Resp: (!) 26 (!) 27 (!) 27 (!) 24  Temp:   97.7 F (36.5 C)   TempSrc:   Axillary   SpO2:      Weight:      Height:        Exam: General: intubated and sedated chronically ill appearing Head: NCAT +sclera icterus  Neck: Supple.  Lungs: on vent, bilat rhonchi ant, no wheezing Heart: RRR. No murmur, rubs or gallops.  Abdomen: soft, nontender, +BS, +ascites Lower extremities: 1+ edema LE's/ UE's Neuro: unresponsive, sedated  Dialysis Access: [facility] R[facility]    Home meds - wellbutrin er, decadron, diflucan, neurontin, percocet prn, lipitor, clonidine 0.4 tid, lasix 80 qd, glipizide, hydralazine 100 tid, toprol xl 100 bid, prns    Summary: hx of SLE, brain aneurysm repair/ CVA's w/ L sided weakness, uses WC, DM2, HTN, ESRD on HD. Here w/ wt loss 50 lbs over 4 mos, difficulty swallowing. No HD issues. Frequent UTI's, home self-cath once daily. New sx's were hacking cough. Found unresponsive and 911 was called.     OP HD: [facility] TTS (transient, usual HD is in [place])   3.5h  300/500  55kg  2/2 bath  R IJ TDC Heparin 3000  - mircera 30 q2, not yet given  - hect 2 ug tiw   Assessment/ Plan: Septic shock /Pseudomonas PNA / aspiration PNA- getting maxepime and vanc and IV antifungals. Remains on pressors x 2- also on steroids Acute hypoxic resp failure / ARDS/ asp PNA - per CCM, on vent-  still on 100 % FIO2 ESRD - on HD TTS, transient from [place]. CRRT started here 11/18. Remain sin shock on pressors, cont CRRT.      Volume - +sig LE edema, 5-6kg up. Not sure we can safely UF w/ her hemodynamic instability but given high fio2 will try 50 per hour Anemia CKD - Hb 7- 9, started darbe -  will inc dose. Low iron but  cannot get IV Fe d/t very high ferritin > 7500. Transfuse PRN now especially with low platelets MBD ckd - stop vdra IV given corrected calcium very high, phos OK right now H/o SLE H/o adrenal insufficiency - getting IV steroids here DM2 - per CCM Abnormal echo - possible vegetation, will need TEE when more stable [place]-  will replete K some -  on all 4 K bath  Thrombocytopenia-  part of sepsis picture/DIC-  for platelet transfusion today and per primary team    Louis Meckel  08/16/2021, 7:22 AM   Recent Labs  Lab 08/15/21 1600 08/15/21 1630 08/16/21 0036 08/16/21 0404  K 3.8   < > 3.7 3.8  BUN 9  --   --  8  CREATININE 1.05*  --   --  0.89  CALCIUM 10.3  --   --  10.1  PHOS 2.1*  --   --  2.8  HGB  --    < > 7.1* 7.3*   < > = values in this interval not displayed.   Inpatient medications:  chlorhexidine gluconate (MEDLINE KIT)  15 mL Mouth Rinse BID   Chlorhexidine Gluconate  Cloth  6 each Topical Daily   darbepoetin (ARANESP) injection - DIALYSIS  40 mcg Subcutaneous Q Sat-HD   docusate  100 mg Per Tube BID   doxercalciferol  2 mcg Intravenous Q T,Th,Sat-1800   hydrocortisone sod succinate (SOLU-CORTEF) inj  100 mg Intravenous Q12H   mouth rinse  15 mL Mouth Rinse 10 times per day   pantoprazole (PROTONIX) IV  40 mg Intravenous Q12H   polyethylene glycol  17 g Per Tube Daily   sodium chloride flush  10-40 mL Intracatheter Q12H     prismasol BGK 4/2.5 200 mL/hr at 08/15/21 1400    prismasol BGK 4/2.5 400 mL/hr at 08/16/21 0612   sodium chloride Stopped (08/15/21 0215)   anidulafungin 100 mg (08/15/21 1010)   ceFEPime (MAXIPIME) IV 2 g (08/15/21 2206)   dextrose 5 % and 0.9% NaCl 75 mL/hr at 08/16/21 0700   doxycycline (VIBRAMYCIN) IV Stopped (08/16/21 0535)   feeding supplement (VITAL 1.5 CAL) Stopped (08/15/21 1915)   fentaNYL infusion INTRAVENOUS 250 mcg/hr (08/16/21 0700)   midazolam 3 mg/hr (08/16/21 0700)   norepinephrine (LEVOPHED) Adult infusion 29  mcg/min (08/16/21 0700)   norepinephrine Stopped (08/19/2021 1146)   prismasol BGK 4/2.5 1,750 mL/hr at 08/16/21 4287   vancomycin 750 mg (08/15/21 2306)   vasopressin 0.03 Units/min (08/16/21 0700)   sodium chloride, alteplase, docusate, fentaNYL (SUBLIMAZE) injection, heparin, midazolam, midazolam, midazolam, norepinephrine, polyethylene glycol, polyvinyl alcohol, rocuronium, sodium chloride, sodium chloride flush

## 2021-08-16 NOTE — Progress Notes (Signed)
eLink Physician-Brief Progress Note Patient Name: Adriana Tucker DOB: 1980-10-08 MRN: [id number]   Date of Service  08/16/2021  HPI/Events of Note  Nursing request for CXR and CBC in AM.  eICU Interventions  Plan: Portable CXR at 5 AM. CBC with platelets at 5 AM.      Intervention Category Major Interventions: Other:  Lysle Dingwall 08/16/2021, 12:25 AM

## 2021-08-16 NOTE — Progress Notes (Signed)
NAME:  Adriana Tucker, MRN:  [id number], DOB:  [birth year], LOS: 4 ADMISSION DATE:  08/16/2021, CONSULTATION DATE:  11/17 REFERRING MD:  Kommor, EDP, CHIEF COMPLAINT:  AMS   History of Present Illness:  Adriana Tucker is a 40 y.o. 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  She presented to [facility] ED 11/17 with AMS.  Family apparently had trouble waking her up that morning; therefore, EMS was called.  On EMS arrival, she was minimally responsive to noxious stimuli. Per mother, pt had been complaining of chills and always feeling "cold to the bone".  Also developed a cough the night prior to ED presentation.  Otherwise, no complaints and she was in her usual state of health.  She has had about roughly 50-60lbs weight loss in the past 2-3 months.  She has seen nephrology, rheumatology, and PCP back in [facility] without any explanation / diagnosis.   In ED, CT head and CTA head/neck negative for acute process (did show remote known bilateral cerebellar infarcts). CTA chest/abd/pelv neg for aneurysm or dissection, did show probable aspiration PNA and small volume ascites along with cholelithiasis. She was intubated for airway protection and she was found to be profoundly hypotensive; therefore, was started on NE, epi, vaso.  CVL and art line were placed by EDP. PCCM called for admission to ICU. Neuro consulted for assistance.  Pertinent  Medical History   Past Medical History:  Diagnosis Date   Brain aneurysm    ESRD (end stage renal disease) ([place])    due to lupus   Hypertension    Lupus (HCC)    Pancytopenia (HCC)    Pleuropericarditis    SAH (subarachnoid hemorrhage) ([place])    Significant Hospital Events: Including procedures, antibiotic start and stop dates in addition to other pertinent events   11/17 > admitted to ICU for AMS and shock; started on vasopressors and continued  on vent support 11/18 > CRRT started, bronchoscopy shows GNRs, echocardiogram shows possible valvular vegetation 11/20 > overnight became bradycardic; sedation weaned overnight; back on pressor support and full sedation   Interim History / Subjective:  Overnight, tube feeds coming from mouth and episode of hypoglycemia with increasing vasopressor requirement.  This morning, patient remains on full vent support and multiple pressors. Continued on CRRT  Objective   Blood pressure (!) 116/58, pulse 98, temperature (!) 97.2 F (36.2 C), temperature source Axillary, resp. rate (!) 35, height 5\' 1"  (1.549 m), weight 60.8 kg, SpO2 92 %. CVP:  [5 mmHg-8 mmHg] 5 mmHg  Vent Mode: PRVC FiO2 (%):  [80 %-100 %] 80 % Set Rate:  [30 bmp-35 bmp] 35 bmp Vt Set:  [380 mL] 380 mL PEEP:  [12 cmH20] 12 cmH20 Plateau Pressure:  [27 cmH20-29 cmH20] 27 cmH20   Intake/Output Summary (Last 24 hours) at 08/16/2021 1226 Last data filed at 08/16/2021 1220 Gross per 24 hour  Intake 4439.94 ml  Output 4622.19 ml  Net -182.25 ml   Filed Weights   08/15/21 0015 08/15/21 0107 08/16/21 0305  Weight: 61 kg 61 kg 60.8 kg    Examination: General: acutely ill, not responsive  HENT: [place]/AT, anicteric sclerae, ETT to vent Lungs: diminished bilaterally, no wheezes or crackles Cardiovascular: tachycardic, regular, right sided tunneled HD line Abdomen: soft, nondistended, +BS Neuro: deeply sedated, not responsive GU: Foley in place Skin: warm and  dry, no rash   Resolved Hospital Problem list    Assessment & Plan:  [name] is a 40 y.o. woman with history of SLE nephritis and ESRD  Acute hypoxemic and hypercapnic respiratory failure in setting of ARDS 2/2 Pseudomonas PNA CT Chest with extensive bilateral consolidations consistent with aspiration PNA.  Patient requiring full vent support. CXR concerning for possible loculated basilar pneumothorax or parenchymal cavitation this AM. ARDS physiology with P/F  ratio 96 this morning.  - Goal Vt to 8 cc/kg to help with hypercapnia. Goal pH>7.2  - Continued on full sedation and antibiotics (cefepime, doxycycline, Eraxis) - BAL AFB cultures pending  -CT Chest wo contrast   Septic shock 2/2 PSA PNA Patient continues to require vasopressor support in setting of pneumonia.  - continue cefepime, doxycycline and eraxis. D/c vanc given no gram positives thusfar - Continue vasopressor support for goal MAP> 65 - Trend CBC and fever curve  Acute encephalopathy - not improving - reportedly following commands while off sedation on 11/17, but not tolerating time off sedation right now - still requires deep sedation with versed and fentanyl   ESRD on HD AGMA - likely 2/2 septic shock as above Has been on dialysis for 6 years due to lupus nephritis. Hypomagnesemia, hyperphosphatemia  - Nephrology consulted for dialysis - continue CRRT - Trend renal function  Anemia of chronic disease  Likely in due to chronic renal disease;  - Trend CBC - Transfuse for goal Hb >7  Pancytopenia Severe leukopenia and thrombocytopenia, has anemia of chronic disease. Leukopenia and thrombocytopenia likely in setting of acute illness. Transfused 2u platelets overnight  - Trend CBC - Peripheral smear   Hx of lupus  Adrenal insufficiency - hx of long term steroid use - Continue stress dose steroids   Hx of DM - continue tube feeds CBG with SSI  Abnormal Echocardiogram - concerning for endocarditis - blood cx negative - continue antibiotics and anti-fungal as ordered - will need TEE once more stable  Best Practice (right click and "Reselect all SmartList Selections" daily)   Diet/type: tubefeeds DVT prophylaxis: SCD GI prophylaxis: PPI Lines: Central line and Dialysis Catheter Foley:  Yes, and it is still needed Code Status:  full code Last date of multidisciplinary goals of care discussion [`11/21 mother and sister updated at bedside]  Labs   CBC: Recent  Labs  Lab 08/16/2021 0830 08/14/2021 0849 08/13/21 0404 08/13/21 0449 08/14/21 0323 08/14/21 0815 08/15/21 0317 08/15/21 0921 08/15/21 1630 08/16/21 0036 08/16/21 0404 08/16/21 0915  WBC 2.0*  --  1.4*  --  2.5*  --  0.8*  --   --   --  2.2*  --   NEUTROABS 1.4*  --   --   --   --   --   --   --   --   --  2.0  --   HGB 7.7*   < > 8.7*   < > 7.9*   < > 7.9* 7.5* 7.8* 7.1* 7.3* 7.8*  HCT 25.3*   < > 26.9*   < > 24.2*   < > 25.2* 22.0* 23.0* 21.0* 23.9* 23.0*  MCV 92.7  --  87.1  --  88.6  --  90.3  --   --   --  90.9  --   PLT 50*  --  49*  --  35*  --  14*  --   --   --  <5*  --    < > =  values in this interval not displayed.    Basic Metabolic Panel: Recent Labs  Lab 08/13/21 0404 08/13/21 0449 08/13/21 1909 08/14/21 0030 08/14/21 0323 08/14/21 0815 08/14/21 1600 08/14/21 1633 08/15/21 0317 08/15/21 0921 08/15/21 1600 08/15/21 1630 08/16/21 0036 08/16/21 0404 08/16/21 0915  NA 131*   < >  --    < > 131*   < > 134*   < > 136   < > 137 139 138 137 138  K 4.1   < >  --    < > 4.3   < > 4.4   < > 4.3   < > 3.8 3.6 3.7 3.8 4.1  CL 100  --   --   --  101  --  104  --  103  --  105  --   --  107  --   CO2 16*  --   --   --  19*  --  20*  --  21*  --  22  --   --  21*  --   GLUCOSE 124*  --   --   --  146*  --  114*  --  73  --  146*  --   --  150*  --   BUN 43*  --   --   --  24*  --  15  --  11  --  9  --   --  8  --   CREATININE 6.27*  --   --   --  3.00*  --  1.72*  --  1.29*  --  1.05*  --   --  0.89  --   CALCIUM 7.8*  --   --   --  8.4*  --  9.1  --  10.0  --  10.3  --   --  10.1  --   MG 1.3*  --  2.8*  --  2.5*  --   --   --  2.4  --   --   --   --  2.4  --   PHOS 5.3*  --   --   --  4.4  --  3.4  --  3.3  --  2.1*  --   --  2.8  --    < > = values in this interval not displayed.   GFR: Estimated Creatinine Clearance: 69.6 mL/min (by C-G formula based on SCr of 0.89 mg/dL). Recent Labs  Lab 08/15/2021 0830 08/20/2021 1030 08/01/2021 1100 08/13/21 0404  08/14/21 0323 08/15/21 0317 08/16/21 0404  PROCALCITON  --  37.81  --   --   --   --   --   WBC 2.0*  --   --  1.4* 2.5* 0.8* 2.2*  LATICACIDVEN 2.7*  --  2.2*  --   --   --   --     Liver Function Tests: Recent Labs  Lab 08/10/2021 0830 08/14/21 0323 08/14/21 1600 08/15/21 0317 08/15/21 1600 08/16/21 0404  AST 17  --   --   --   --   --   ALT 16  --   --   --   --   --   ALKPHOS 33*  --   --   --   --   --   BILITOT 1.1  --   --   --   --   --   PROT 4.0*  --   --   --   --   --  ALBUMIN 2.0* 1.7* 1.7* 1.7* 1.6* <1.5*   No results for input(s): LIPASE, AMYLASE in the last 168 hours. Recent Labs  Lab 08/08/2021 0830  AMMONIA 17    ABG    Component Value Date/Time   PHART 7.197 (LL) 08/16/2021 0915   PCO2ART 50.8 (H) 08/16/2021 0915   PO2ART 77 (L) 08/16/2021 0915   HCO3 19.8 (L) 08/16/2021 0915   TCO2 21 (L) 08/16/2021 0915   ACIDBASEDEF 8.0 (H) 08/16/2021 0915   O2SAT 92.0 08/16/2021 0915     Coagulation Profile: No results for input(s): INR, PROTIME in the last 168 hours.  Cardiac Enzymes: No results for input(s): CKTOTAL, CKMB, CKMBINDEX, TROPONINI in the last 168 hours.  HbA1C: Hgb A1c MFr Bld  Date/Time Value Ref Range Status  12/30/2014 09:58 AM 6.2 (H) 4.8 - 5.6 % Final    Comment:    (NOTE)         Pre-diabetes: 5.7 - 6.4         Diabetes: >6.4         Glycemic control for adults with diabetes: <7.0     CBG: Recent Labs  Lab 08/15/21 1558 08/15/21 1934 08/15/21 2310 08/16/21 0409 08/16/21 0723  GLUCAP 123* 137* 146* 134* 131*   Critical care time: 41 minutes   Harvie Heck, MD Internal Medicine, PGY-3 08/16/21 12:26 PM Pager # [phone]  If no response to pager , please call critical care on call (see AMION) until 7pm After 7:00 pm call Elink

## 2021-08-16 NOTE — Progress Notes (Signed)
eLink Physician-Brief Progress Note Patient Name: Adriana Tucker DOB: Jan 25, 1981 MRN: [id number]   Date of Service  08/16/2021  HPI/Events of Note  Thrombocytopenia - Platelet count < 5.  eICU Interventions  Plan: Transfuse 2 units single donor platelets now. Repeat CBC with Platelets at 12 noon.      Intervention Category Major Interventions: Other:  Lysle Dingwall 08/16/2021, 5:58 AM

## 2021-08-17 LAB — PREPARE PLATELET PHERESIS
Unit division: 0
Unit division: 0
Unit division: 0
Unit division: 0
Unit division: 0
Unit division: 0

## 2021-08-17 LAB — CBC
HCT: 19 % — ABNORMAL LOW (ref 36.0–46.0)
Hemoglobin: 5.2 g/dL — CL (ref 12.0–15.0)
MCH: 27.8 pg (ref 26.0–34.0)
MCHC: 27.4 g/dL — ABNORMAL LOW (ref 30.0–36.0)
MCV: 101.6 fL — ABNORMAL HIGH (ref 80.0–100.0)
Platelets: 12 10*3/uL — CL (ref 150–400)
RBC: 1.87 MIL/uL — ABNORMAL LOW (ref 3.87–5.11)
RDW: 17.8 % — ABNORMAL HIGH (ref 11.5–15.5)
WBC: 3.1 10*3/uL — ABNORMAL LOW (ref 4.0–10.5)
nRBC: 6.5 % — ABNORMAL HIGH (ref 0.0–0.2)

## 2021-08-17 LAB — POCT I-STAT 7, (LYTES, BLD GAS, ICA,H+H)
Acid-base deficit: 21 mmol/L — ABNORMAL HIGH (ref 0.0–2.0)
Bicarbonate: 9.3 mmol/L — ABNORMAL LOW (ref 20.0–28.0)
Calcium, Ion: 1.34 mmol/L (ref 1.15–1.40)
HCT: 15 % — ABNORMAL LOW (ref 36.0–46.0)
Hemoglobin: 5.1 g/dL — CL (ref 12.0–15.0)
O2 Saturation: 92 %
Patient temperature: 96
Potassium: 5.4 mmol/L — ABNORMAL HIGH (ref 3.5–5.1)
Sodium: 135 mmol/L (ref 135–145)
TCO2: 11 mmol/L — ABNORMAL LOW (ref 22–32)
pCO2 arterial: 40.8 mmHg (ref 32.0–48.0)
pH, Arterial: 6.954 — CL (ref 7.350–7.450)
pO2, Arterial: 92 mmHg (ref 83.0–108.0)

## 2021-08-17 LAB — BPAM PLATELET PHERESIS
Blood Product Expiration Date: 202211212359
Blood Product Expiration Date: 202211222359
Blood Product Expiration Date: 202211232359
Blood Product Expiration Date: 202211242359
Blood Product Expiration Date: 202211242359
Blood Product Expiration Date: 202211242359
ISSUE DATE / TIME: 202211210822
ISSUE DATE / TIME: 202211211027
ISSUE DATE / TIME: 202211211741
ISSUE DATE / TIME: 202211211741
ISSUE DATE / TIME: 202211220406
ISSUE DATE / TIME: 202211220406
Unit Type and Rh: 6200
Unit Type and Rh: 6200
Unit Type and Rh: 6200
Unit Type and Rh: 6200
Unit Type and Rh: 600
Unit Type and Rh: 6200

## 2021-08-17 LAB — CULTURE, BLOOD (ROUTINE X 2)
Culture: NO GROWTH
Culture: NO GROWTH
Special Requests: ADEQUATE

## 2021-08-17 LAB — GLUCOSE, CAPILLARY: Glucose-Capillary: 85 mg/dL (ref 70–99)

## 2021-08-17 LAB — PREPARE RBC (CROSSMATCH)

## 2021-08-17 LAB — APTT: aPTT: 110 seconds — ABNORMAL HIGH (ref 24–36)

## 2021-08-17 LAB — MAGNESIUM: Magnesium: 3 mg/dL — ABNORMAL HIGH (ref 1.7–2.4)

## 2021-08-17 LAB — TRIGLYCERIDES: Triglycerides: 200 mg/dL — ABNORMAL HIGH (ref <150)

## 2021-08-17 MED ORDER — SODIUM BICARBONATE 8.4 % IV SOLN
INTRAVENOUS | Status: AC
  Administered 2021-08-17: 100 meq via INTRAVENOUS
  Filled 2021-08-17: qty 50

## 2021-08-17 MED ORDER — SODIUM BICARBONATE 8.4 % IV SOLN: 100.0000 meq | Freq: Once | INTRAVENOUS | Status: AC

## 2021-08-17 MED ORDER — SODIUM CHLORIDE 0.9% IV SOLUTION: Freq: Once | INTRAVENOUS | Status: DC

## 2021-08-17 MED ORDER — ALTEPLASE 2 MG IJ SOLR: 2.0000 mg | Freq: Once | INTRAMUSCULAR | Status: DC

## 2021-08-17 MED ORDER — ALTEPLASE 2 MG IJ SOLR
4.0000 mg | Freq: Once | INTRAMUSCULAR | Status: AC
  Administered 2021-08-17: 2 mg
  Filled 2021-08-17: qty 4

## 2021-08-18 LAB — TYPE AND SCREEN
ABO/RH(D): O POS
Antibody Screen: NEGATIVE
Unit division: 0

## 2021-08-18 LAB — BPAM RBC
Blood Product Expiration Date: 202212202359
ISSUE DATE / TIME: 202211220204
Unit Type and Rh: 5100

## 2021-08-23 ENCOUNTER — Telehealth: Payer: Self-pay | Admitting: Pulmonary Disease

## 2021-08-23 NOTE — Telephone Encounter (Signed)
Mother, Blanch Media called back and states her out of work date started 11/21 and her return to work date is suppose to be 12/17- continuous leave.

## 2021-08-23 NOTE — Telephone Encounter (Signed)
Received disability paperwork from [employer]. Emailed Dr. Vaughan Browner to see if he would be able to fill out paperwork for patient's mother.

## 2021-08-23 NOTE — Telephone Encounter (Signed)
Dr. Vaughan Browner will sign forms- emailed forms to [facility] for review. Attempted to call patient's mother to ask for first day out of work, a return to work date, and to confirm it was a continuous leave of absence- unable to leave voicemail.

## 2021-08-26 NOTE — Death Summary Note (Signed)
  Name: CARITA SOLLARS MRN: [id number] DOB: 1981/02/12 40 y.o.  Date of Admission: 08/20/2021  8:03 AM Date of Discharge: 2021-08-23 Attending Physician: Dr Marshell Garfinkel  Discharge Diagnosis: Acute hypoxemic and hypercapnic respiratory failure  ARDS 2/2 pseudomonas pneumonia Septic shock 2/2 pseudomonas pneumonia Acute metabolic encephalopathy ESRD on HD due to lupus nephritis  Anion gap metabolic acidosis Pancytopenia Hx of lupus on long term steroids  Adrenal insufficiency  Diabetes mellitus Anemia of chronic disease   Cause of death: Bradycardic arrest Time of death: 08/23/2021 at 0422  Disposition and follow-up:   Ms.Hodaya T Woolsey was discharged from [facility] in expired condition.    Hospital Course: Ms Ivori Storr is a 40 year old female with history of end stage renal disease secondary to lupus nephritis from [facility] presented with altered mental status. CT Head and CTA Head/Neck negative for acute intracerebral etiology. CTA Chest/Abd/Pelvis with aspiration pneumonia. Patient intubated for airway protection and found to be profoundly hypotensive for which started on multiple vasopressors and broad spectrum antibiotics for aspiration pneumonia. Admitted to ICU. She was started on CRRT and tolerated this well. Bronchoscopy with pseudomonas pneumonia for which patient continued on Cefepime; however, continued to develop ARDS physiology. Antibiotics broadened to cefepime, doxycycline, and antifungal coverage with Eraxis. However, continued to have worsening multiorgan failure and acidosis despite maximal pressor support and being on CRRT and bicarb gtt. Patient eventually succumbed to bradycardic arrest early morning of 2023-08-24. ACLS performed with persistent asystole on every pulse check. Patient deceased at 2021-08-23 at 39.   Signed: Harvie Heck, MD IMTS PGY-3 August 23, 2021, 10:24 AM

## 2021-08-26 NOTE — Progress Notes (Signed)
Chaplain responded to Code [employer] for support of family.  Chaplain offered ministry of presence.  Chaplain prayed at pt's bedside.   Adriana Tucker

## 2021-08-26 NOTE — Progress Notes (Signed)
eLink Physician-Brief Progress Note Patient Name: Adriana Tucker DOB: January 22, 1981 MRN: [id number]   Date of Service  Sep 03, 2021  HPI/Events of Note  ABG worsening acidosis on CRRT. HD clotted 1am. Holding CRRT for alteplase to be instilled  iSTAT Hg 5.1 -->repeat CBC 5.2, Plt 12  eICU Interventions  2 amps bicarb given Transfuse PRBC and platelets Mother was updated on patient's critical condition. She plans to return to hospital now     Intervention Category Major Interventions: Acid-Base disturbance - evaluation and management  Mckensey Berghuis Rodman Pickle 09-03-21, 1:33 AM

## 2021-08-26 NOTE — Significant Event (Signed)
PCCM INTERVAL PROGRESS NOTE   Called to bedside regarding cardiac arrest. Briefly this is a 40 year old female with ESRD secondary to lupus nephritis. Immunocompromised at baseline. Admitted with acute respiratory failure secondary to pseudomonas pneumonia. Complicated by septic shock and refractory acidosis despite CRRT and Bicarb infusion. CRRT was stopped due to clotted catheter. pH 6.9 at that time. Bicarb amps given. When CRRT restarted the patient suffered bradycardic arrest presumably due to acidosis. See code sheet for details. 5 rounds ACLS performed with asystole on every pulse check. Further resuscitation felt to be futile by the medical and ICU team including myself and Dr. Ilda Mori. Time of death 2.   Georgann Housekeeper, AGACNP-BC [facility]  See Amion for personal pager PCCM on call pager [phone] until 7pm. Please call Elink 7p-7a. [phone]  09/11/21 4:24 AM

## 2021-08-26 DEATH — deceased

## 2021-09-13 LAB — FUNGUS CULTURE WITH STAIN

## 2021-09-13 LAB — FUNGUS CULTURE RESULT

## 2021-09-13 LAB — FUNGAL ORGANISM REFLEX

## 2021-09-15 MED FILL — Medication: Qty: 1 | Status: AC

## 2021-09-26 DEATH — deceased

## 2021-10-11 LAB — ACID FAST CULTURE WITH REFLEXED SENSITIVITIES (MYCOBACTERIA): Acid Fast Culture: NEGATIVE

## 2022-05-02 IMAGING — CT CT ANGIO HEAD-NECK (W OR W/O PERF)
3 of 7 series · 14 of 47 positions shown · IV contrast (Omni 300)
Comparison: MRI head and MRA head and neck 12/30/2014. CT head
08/12/2021

CLINICAL DATA: Unresponsive.  History of aneurysm.

EXAM:
CT ANGIOGRAPHY HEAD AND NECK
TECHNIQUE: Multidetector CT imaging of the head and neck was performed using
the standard protocol during bolus administration of intravenous
contrast. Multiplanar CT image reconstructions and MIPs were
obtained to evaluate the vascular anatomy. Carotid stenosis
measurements (when applicable) are obtained utilizing NASCET
criteria, using the distal internal carotid diameter as the
denominator.
CONTRAST:  150mL OMNIPAQUE IOHEXOL 350 MG/ML SOLN

[Series 5: cta neck axial · axial · 0.39mm/px · z∈[-301,-26]mm · 8 of 339 slices shown]
[im 40/339  brain]
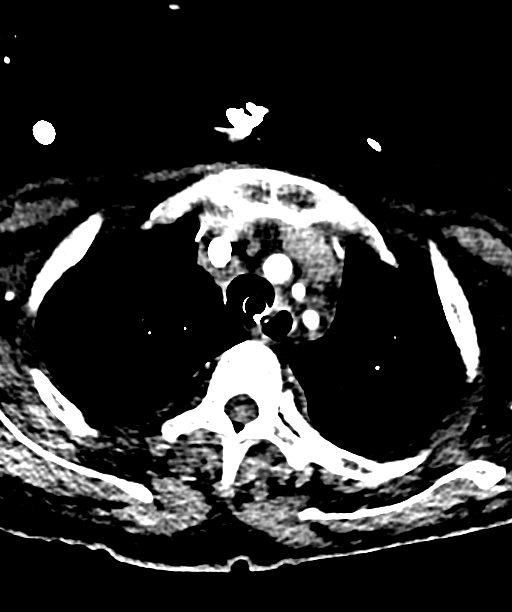
[im 80/339  bone]
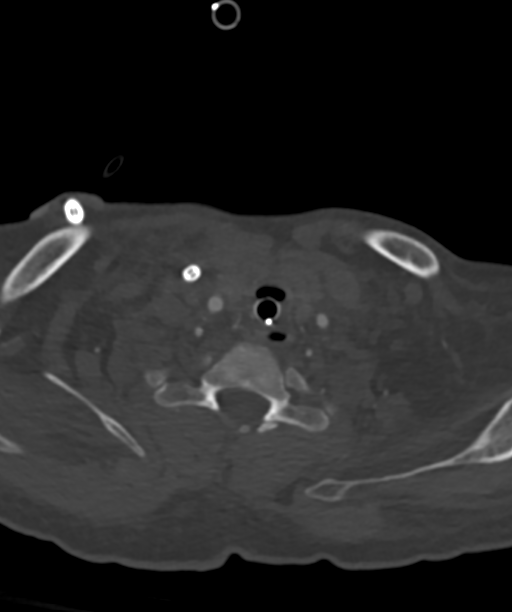
[im 120/339  brain]
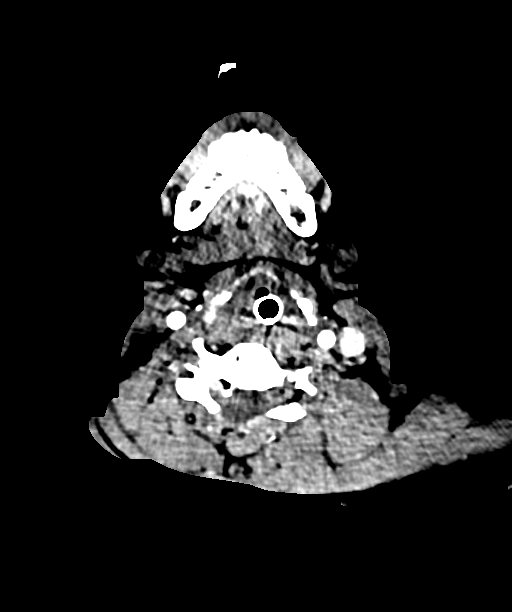
[im 160/339  bone]
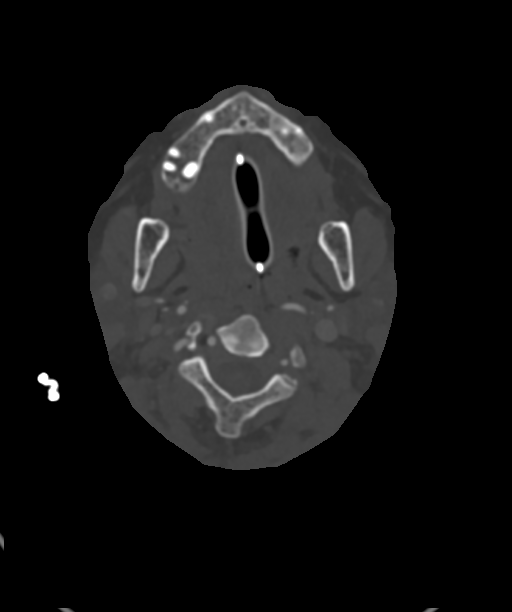
[im 199/339  brain]
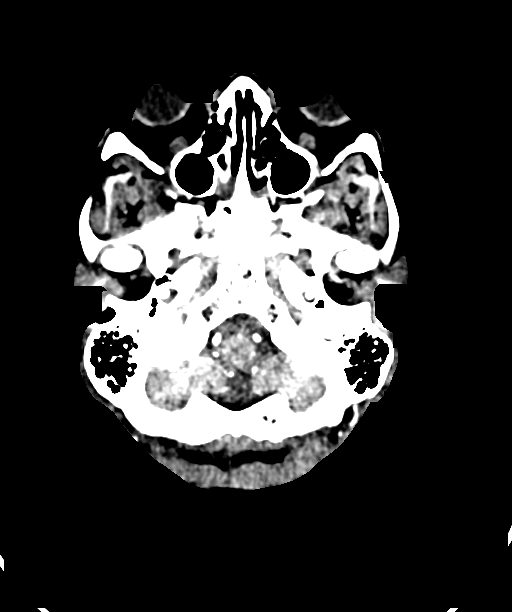
[im 239/339  bone]
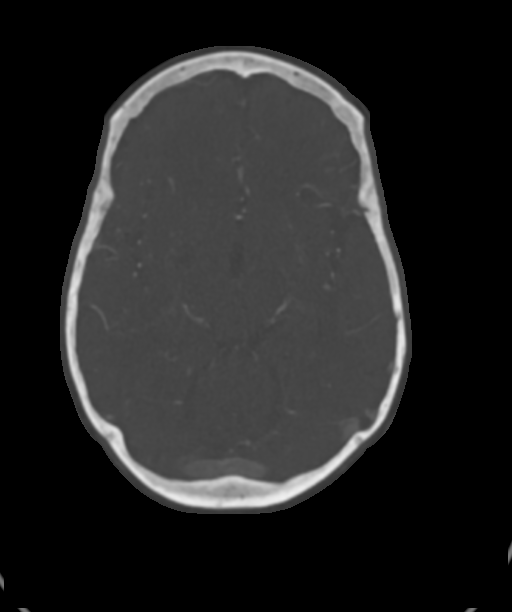
[im 279/339  brain]
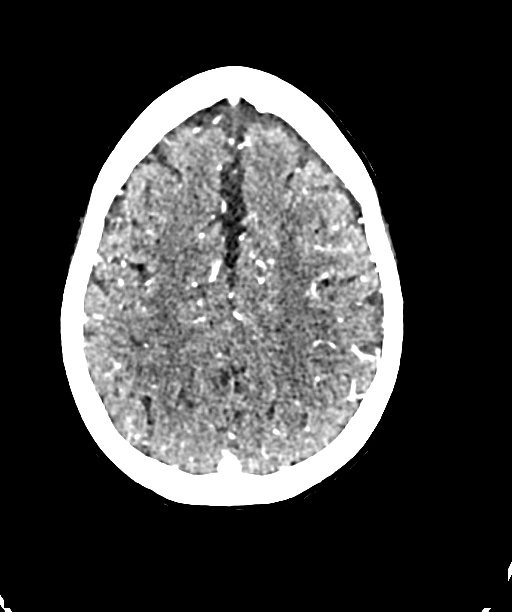
[im 319/339  bone]
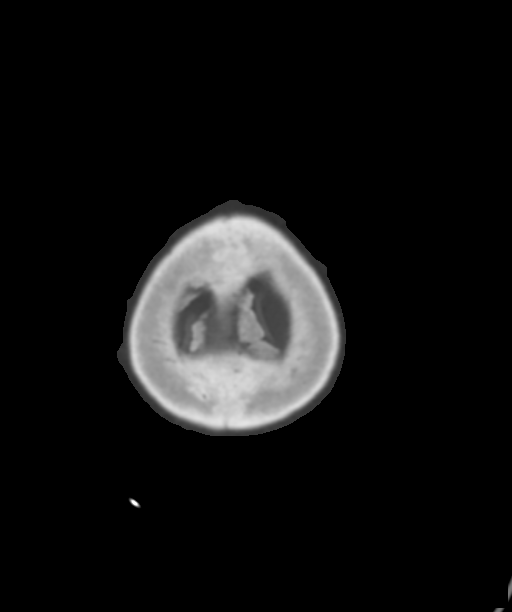

[Series 6: cta neck coronal · coronal · 0.40mm/px · 3 of 215 slices shown]
[im 62/215  brain]
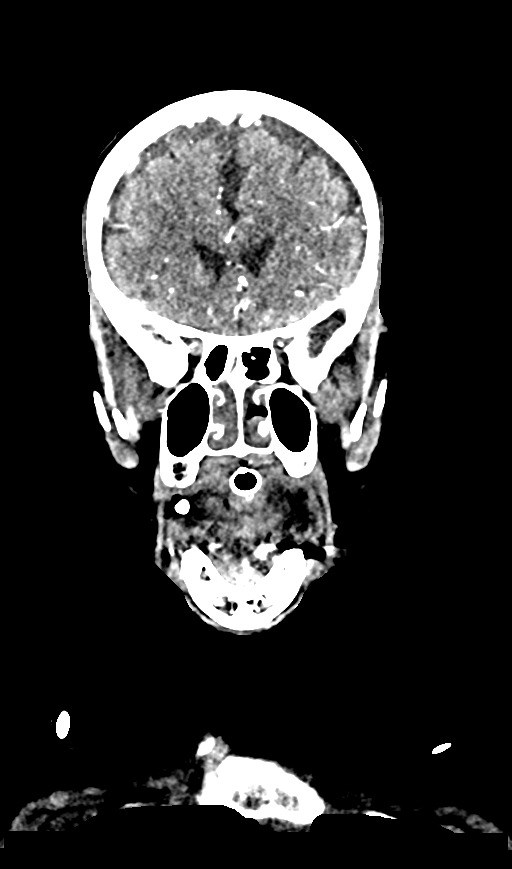
[im 92/215  brain]
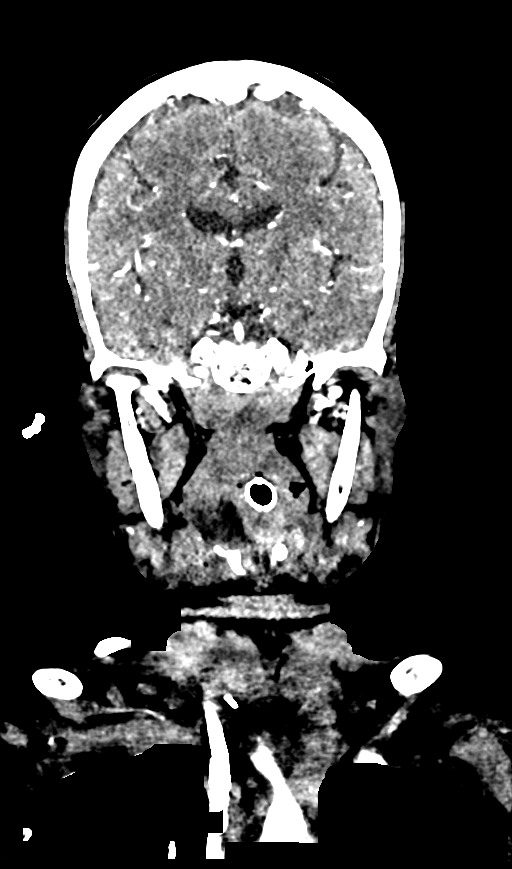
[im 123/215  brain]
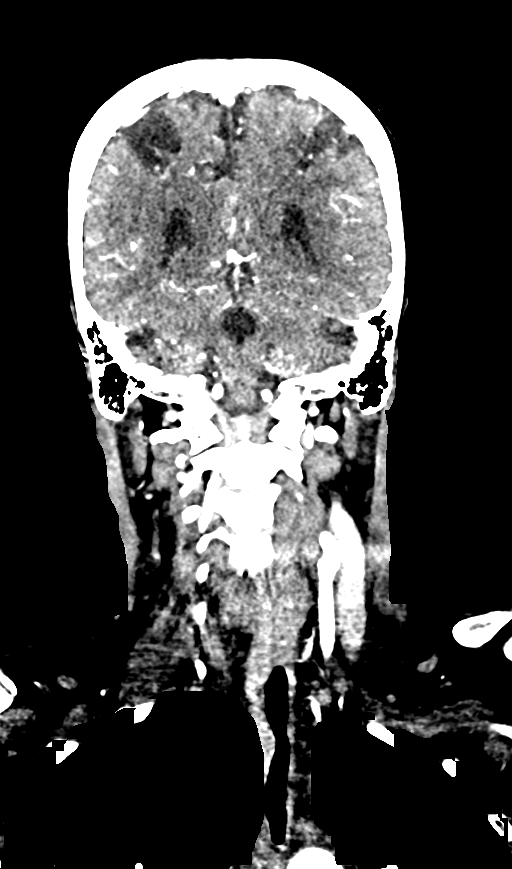

[Series 7: cta neck sagittal · sagittal · 0.53mm/px · 3 of 201 slices shown]
[im 41/201  brain]
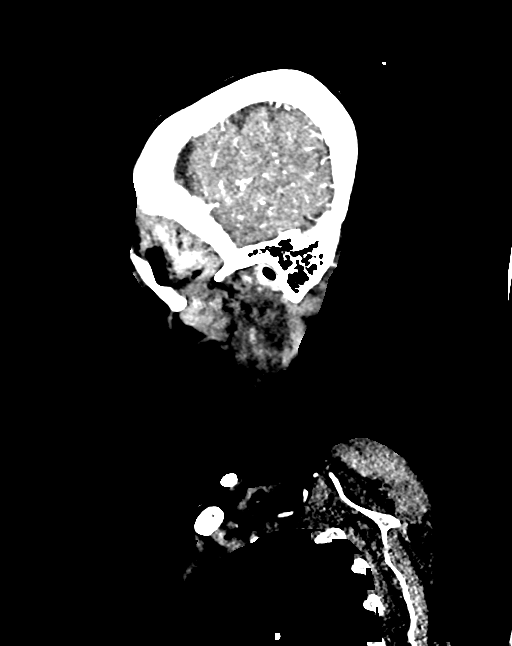
[im 81/201  brain]
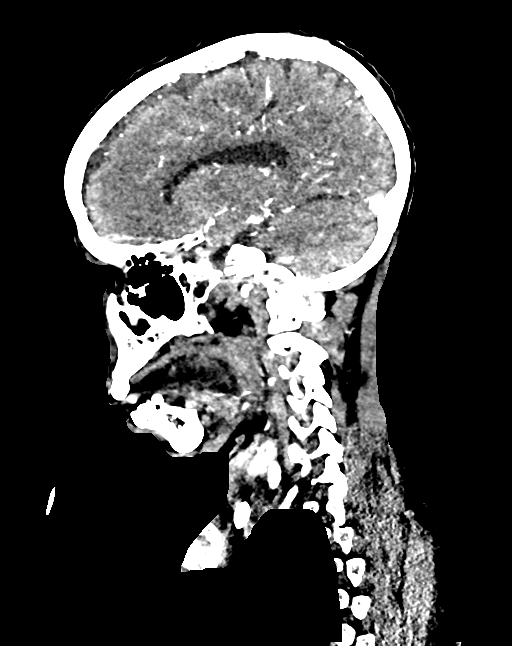
[im 121/201  brain]
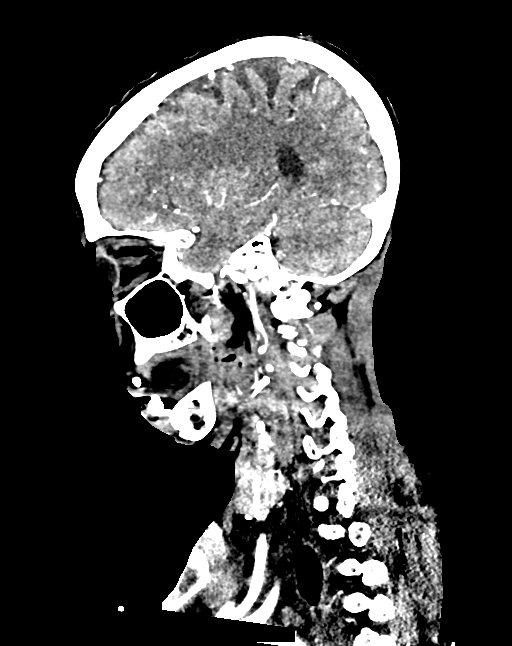

[14 of 47 positions shown; findings below may reference images not displayed]

FINDINGS: CTA NECK FINDINGS

Aortic arch: Proximal great vessels widely patent. Bovine branching
arch.

Right carotid system: Right carotid widely patent. Negative for
stenosis or atherosclerotic disease.

Left carotid system: Left carotid bifurcation widely patent. There
is small caliber of the left internal carotid artery throughout the
neck and skull base without focal stenosis or dissection. Review of
the prior MRA neck without contrast 12/30/2014 reveals progression
of small caliber left internal carotid artery. This may be due to
decreased outflow with hypoplastic left A1 segment.

Vertebral arteries: Both vertebral arteries patent to the basilar.

Skeleton: Bilateral laminectomy C3 through C7. Surgical clip in the
spinal canal on the right at C5-6. This may be a vascular clip. No
vascular malformation is identified in this area.

Other neck: Patient is intubated. No mass in the neck. Right jugular
central venous catheter tip.

Upper chest: Mild atelectasis left upper lobe. Right upper lobe
clear.

Review of the MIP images confirms the above findings

CTA HEAD FINDINGS

Anterior circulation: Cavernous carotid patent bilaterally. Left
cavernous carotid smaller than the right. Hypoplastic but patent
left A1 segment. Right A1 segment normal in size. Both anterior
cerebral arteries patent. Middle cerebral arteries are patent
bilaterally without stenosis or aneurysm.

Posterior circulation: Both vertebral arteries patent to the
basilar. PICA patent bilaterally. Basilar widely patent. Superior
cerebellar and posterior cerebral arteries patent bilaterally
without stenosis. No aneurysm in the posterior circulation.

Venous sinuses: Normal venous enhancement

Anatomic variants: None

Review of the MIP images confirms the above findings
IMPRESSION: 1. No intracranial aneurysm. No intracranial stenosis or large
vessel occlusion
2. Normal right carotid artery
3. Diffusely small caliber left internal carotid artery without
focal stenosis. This may be due to a decreased outflow with
hypoplastic left A1 segment.
4. Cervical laminectomy C3 through C7. Surgical vascular clips in
the spinal canal on the right at C5-6. No vascular malformation is
present in the cervical spine.

## 2022-05-04 IMAGING — DX DG CHEST 1V PORT
1 series · 1 of 1 positions shown · non-contrast
Comparison: August 13, 2021

CLINICAL DATA: Evaluate bibasilar opacities.

EXAM:
PORTABLE CHEST 1 VIEW

[chest ap]
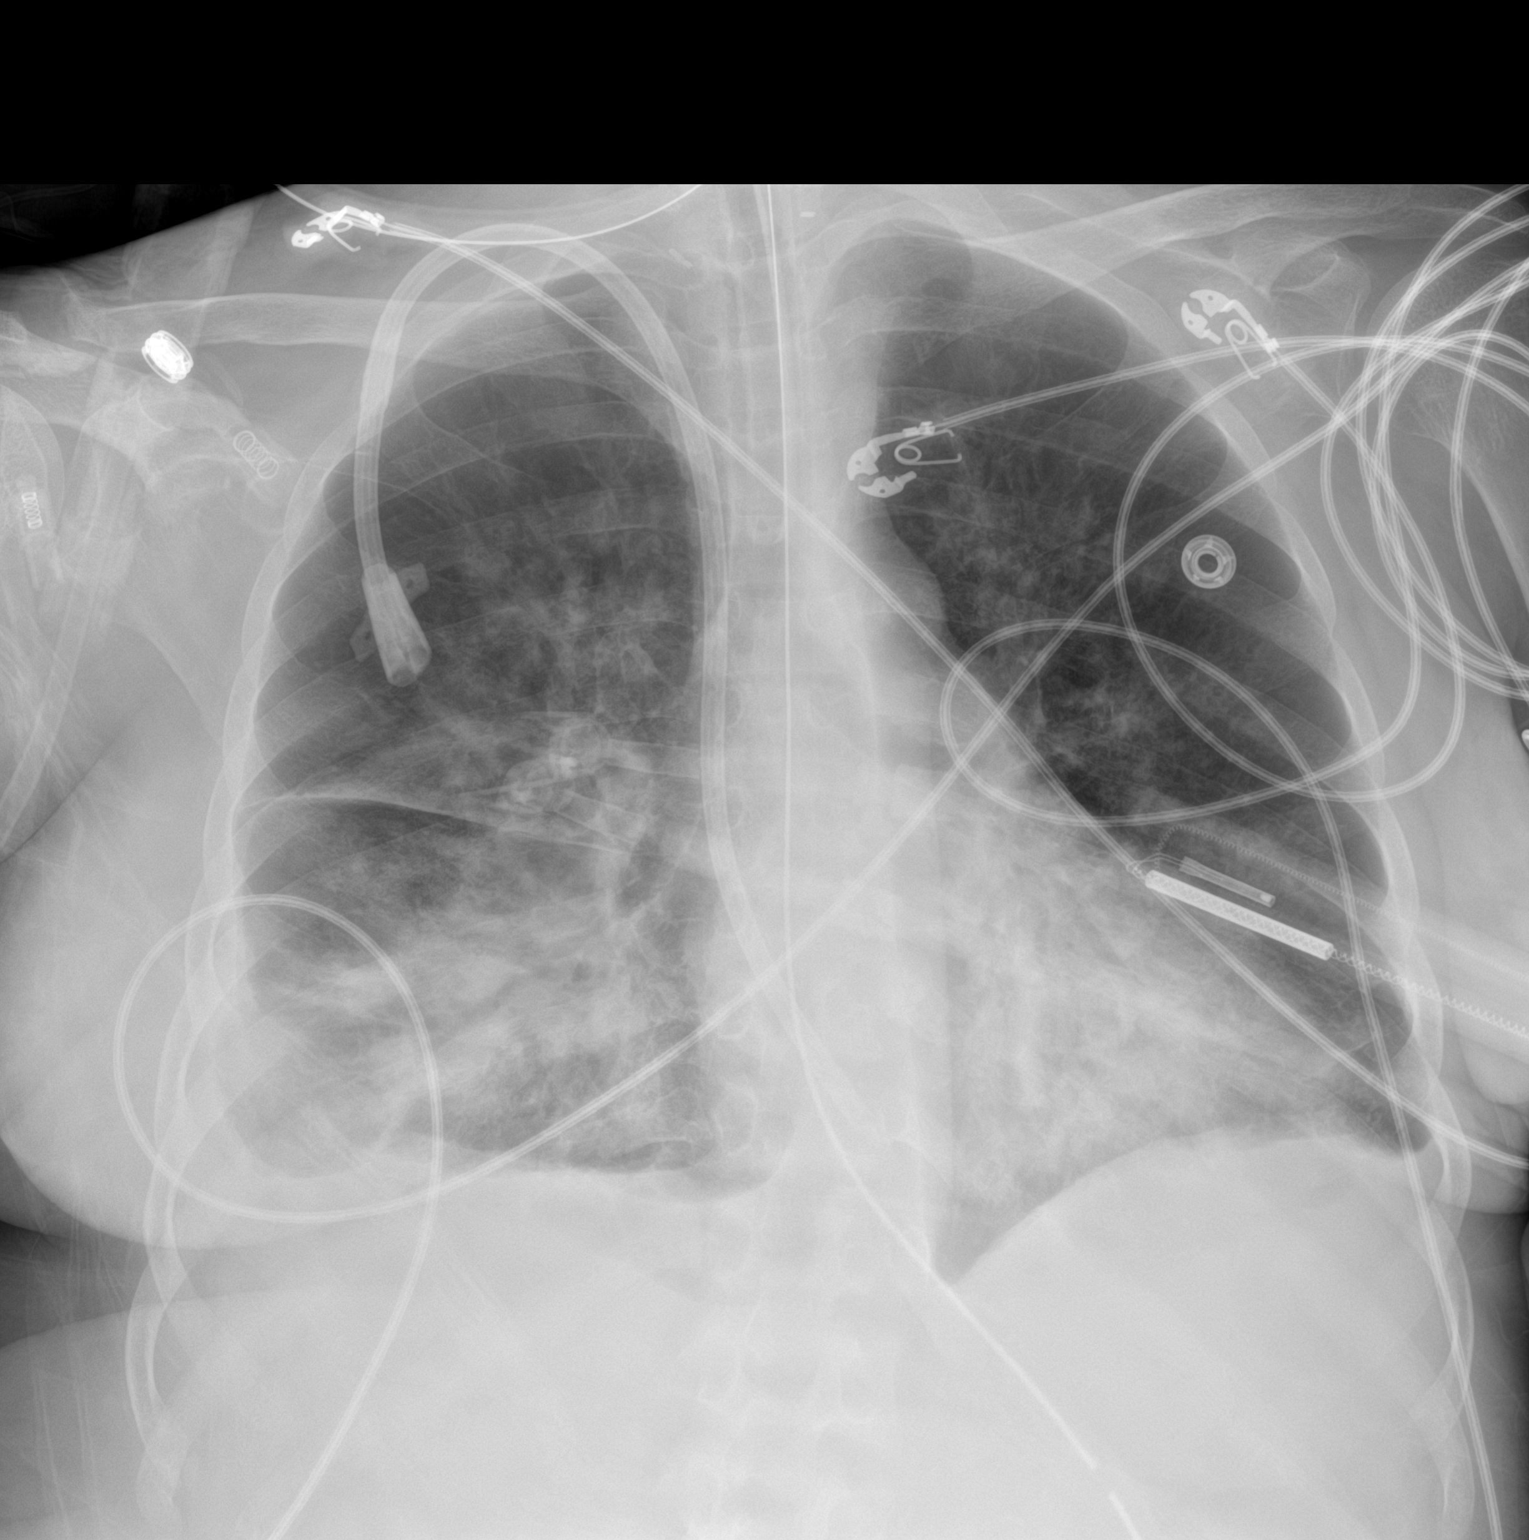

[1 of 1 positions shown; findings below may reference images not displayed]

FINDINGS: The ETT is in good position. The NG tube terminates below today's
film. The right central line is stable. Bibasilar infiltrates
remain, not significantly changed in the interval. No pneumothorax.
No other changes.
IMPRESSION: Stable bibasilar infiltrates.  Support apparatus as above.
